# Patient Record
Sex: Female | Born: 1995
Health system: Southern US, Community
[De-identification: ages and names within clinical notes are randomized; demographics above are authoritative.]

## PROBLEM LIST (undated history)

## (undated) ENCOUNTER — Inpatient Hospital Stay (HOSPITAL_COMMUNITY): Payer: Self-pay

## (undated) DIAGNOSIS — O0930 Supervision of pregnancy with insufficient antenatal care, unspecified trimester: Secondary | ICD-10-CM

## (undated) DIAGNOSIS — F1291 Cannabis use, unspecified, in remission: Secondary | ICD-10-CM

## (undated) DIAGNOSIS — N159 Renal tubulo-interstitial disease, unspecified: Secondary | ICD-10-CM

## (undated) DIAGNOSIS — Z87898 Personal history of other specified conditions: Secondary | ICD-10-CM

## (undated) DIAGNOSIS — N390 Urinary tract infection, site not specified: Secondary | ICD-10-CM

## (undated) DIAGNOSIS — J40 Bronchitis, not specified as acute or chronic: Secondary | ICD-10-CM

## (undated) DIAGNOSIS — Z87891 Personal history of nicotine dependence: Secondary | ICD-10-CM

## (undated) HISTORY — PX: DILATION AND CURETTAGE OF UTERUS: SHX78

---
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
## 2014-03-24 ENCOUNTER — Encounter (HOSPITAL_COMMUNITY): Payer: Self-pay | Admitting: Emergency Medicine

## 2014-03-24 ENCOUNTER — Emergency Department (HOSPITAL_COMMUNITY)
Admission: EM | Admit: 2014-03-24 | Discharge: 2014-03-24 | Disposition: A | Discharge status: Home / Self Care | Payer: Medicaid Other | Attending: Emergency Medicine | Admitting: Emergency Medicine

## 2014-03-24 DIAGNOSIS — Z3202 Encounter for pregnancy test, result negative: Secondary | ICD-10-CM | POA: Insufficient documentation

## 2014-03-24 DIAGNOSIS — Z8709 Personal history of other diseases of the respiratory system: Secondary | ICD-10-CM | POA: Insufficient documentation

## 2014-03-24 DIAGNOSIS — B9689 Other specified bacterial agents as the cause of diseases classified elsewhere: Secondary | ICD-10-CM

## 2014-03-24 DIAGNOSIS — N760 Acute vaginitis: Secondary | ICD-10-CM | POA: Insufficient documentation

## 2014-03-24 LAB — URINALYSIS, ROUTINE W REFLEX MICROSCOPIC
BILIRUBIN URINE: NEGATIVE
GLUCOSE, UA: NEGATIVE mg/dL
Hgb urine dipstick: NEGATIVE
Ketones, ur: NEGATIVE mg/dL
NITRITE: NEGATIVE
PROTEIN: NEGATIVE mg/dL
Specific Gravity, Urine: 1.009 (ref 1.005–1.030)
Urobilinogen, UA: 0.2 mg/dL (ref 0.0–1.0)
pH: 6 (ref 5.0–8.0)

## 2014-03-24 LAB — WET PREP, GENITAL
Trich, Wet Prep: NONE SEEN
YEAST WET PREP: NONE SEEN

## 2014-03-24 LAB — URINE MICROSCOPIC-ADD ON

## 2014-03-24 LAB — POC URINE PREG, ED: PREG TEST UR: NEGATIVE

## 2014-03-24 MED ORDER — METRONIDAZOLE 500 MG PO TABS: 500.0000 mg | ORAL_TABLET | Freq: Two times a day (BID) | ORAL | Status: DC

## 2014-03-24 NOTE — ED Provider Notes (Signed)
CSN: [id number]     Arrival date & time 03/24/14  1130 History   First MD Initiated Contact with Patient 03/24/14 1135     Chief Complaint  Patient presents with  . Urinary Frequency  . Vaginitis     (Consider location/radiation/quality/duration/timing/severity/associated sxs/prior Treatment) HPI Comments: This is a 18 year old female who presents to the emergency department complaining of intermittent dysuria and frequency over the past 2 weeks. States sometimes when she urinates she has burning in her urethra, however other times she does not. She also reports a foul smelling odor from her vagina intermittently. Denies vaginal discharge, however reports when she is having intercourse she can tell that there is a foul-smell. Denies abdominal pain, back pain, nausea, vomiting, fever or chills. States she has a history of bacterial vaginosis and this seems to be the same. The last time she was diagnosed with BV she did not complete the course of antibiotics and is concerned that the infection has remained. States she is sexually active with her "baby daddy" only, about 3-4 months ago she had sexual intercourse with a new partner, but states she uses protection. States she has no concern for any sexually transmitted diseases at this time.  Patient is a 18 y.o. female presenting with frequency. The history is provided by the patient.  Urinary Frequency    Past Medical History  Diagnosis Date  . Bronchitis    History reviewed. No pertinent past surgical history. History reviewed. No pertinent family history. History  Substance Use Topics  . Smoking status: Never Smoker   . Smokeless tobacco: Not on file  . Alcohol Use: No   OB History   Grav Para Term Preterm Abortions TAB SAB Ect Mult Living                 Review of Systems  Genitourinary: Positive for dysuria and frequency.       + Vaginal odor.      Allergies  Review of patient's allergies indicates no known  allergies.  Home Medications   Prior to Admission medications   Medication Sig Start Date End Date Taking? Authorizing Provider  metroNIDAZOLE (FLAGYL) 500 MG tablet Take 1 tablet (500 mg total) by mouth 2 (two) times daily. One po bid x 7 days 03/24/14   Trevor Maceobyn M Albert, PA-C   BP 106/50  Pulse 81  Temp(Src) 97.8 F (36.6 C) (Oral)  Resp 16  Ht 5\' 7"  (1.702 m)  Wt 125 lb (56.7 kg)  BMI 19.57 kg/m2  SpO2 100%  LMP 03/15/2014 Physical Exam  Nursing note and vitals reviewed. Constitutional: She is oriented to person, place, and time. She appears well-developed and well-nourished. No distress.  HENT:  Head: Normocephalic and atraumatic.  Mouth/Throat: Oropharynx is clear and moist.  Eyes: Conjunctivae and EOM are normal.  Neck: Normal range of motion. Neck supple.  Cardiovascular: Normal rate, regular rhythm and normal heart sounds.   Pulmonary/Chest: Effort normal and breath sounds normal. No respiratory distress.  Abdominal: Soft. Bowel sounds are normal. There is no tenderness.  Genitourinary:  NEFG. Speculum exam- moderate amount of white vaginal discharge. No erythema or tenderness. No bleeding. No cervical discharge or friability. Bimanual- no CMT or adnexal tenderness. Uterus normal.  Musculoskeletal: Normal range of motion. She exhibits no edema.  Neurological: She is alert and oriented to person, place, and time. No sensory deficit.  Skin: Skin is warm and dry.  Psychiatric: She has a normal mood and affect. Her behavior is normal.  ED Course  Procedures (including critical care time) Labs Review Labs Reviewed  WET PREP, GENITAL - Abnormal; Notable for the following:    Clue Cells Wet Prep HPF POC FEW (*)    WBC, Wet Prep HPF POC FEW (*)    All other components within normal limits  URINALYSIS, ROUTINE W REFLEX MICROSCOPIC - Abnormal; Notable for the following:    APPearance CLOUDY (*)    Leukocytes, UA MODERATE (*)    All other components within normal limits   URINE MICROSCOPIC-ADD ON - Abnormal; Notable for the following:    Squamous Epithelial / LPF MANY (*)    Bacteria, UA FEW (*)    All other components within normal limits  GC/CHLAMYDIA PROBE AMP  URINE CULTURE  POC URINE PREG, ED    Imaging Review No results found.   EKG Interpretation None      MDM   Final diagnoses:  BV (bacterial vaginosis)   Pt w vaginal odor and dysuria. She is non-toxic appearing and in NAD. AFVSS. Abdomen soft and non-tender. No CMT or adnexal tenderness. Wet prep with few clue cells and WBC. UA contaminated with squamous cells, has moderate leuks, few bacteria and 11-20 WBC, nitrite negative. Given vaginal d/c, I do not feel this is significant for UTI, however vaginal infection. Given urine equivocal, urine culture pending. Pt d/c with flagyl. Infection care/precautions discussed. Return precautions given. Patient states understanding of treatment care plan and is agreeable.   Trevor Mace, PA-C 03/24/14 [phone]

## 2014-03-24 NOTE — Discharge Instructions (Signed)
Take antibiotic to completion.  Bacterial Vaginosis Bacterial vaginosis is a vaginal infection that occurs when the normal balance of bacteria in the vagina is disrupted. It results from an overgrowth of certain bacteria. This is the most common vaginal infection in women of childbearing age. Treatment is important to prevent complications, especially in pregnant women, as it can cause a premature delivery. CAUSES  Bacterial vaginosis is caused by an increase in harmful bacteria that are normally present in smaller amounts in the vagina. Several different kinds of bacteria can cause bacterial vaginosis. However, the reason that the condition develops is not fully understood. RISK FACTORS Certain activities or behaviors can put you at an increased risk of developing bacterial vaginosis, including:  Having a new sex partner or multiple sex partners.  Douching.  Using an intrauterine device (IUD) for contraception. Women do not get bacterial vaginosis from toilet seats, bedding, swimming pools, or contact with objects around them. SIGNS AND SYMPTOMS  Some women with bacterial vaginosis have no signs or symptoms. Common symptoms include:  Grey vaginal discharge.  A fishlike odor with discharge, especially after sexual intercourse.  Itching or burning of the vagina and vulva.  Burning or pain with urination. DIAGNOSIS  Your health care provider will take a medical history and examine the vagina for signs of bacterial vaginosis. A sample of vaginal fluid may be taken. Your health care provider will look at this sample under a microscope to check for bacteria and abnormal cells. A vaginal pH test may also be done.  TREATMENT  Bacterial vaginosis may be treated with antibiotic medicines. These may be given in the form of a pill or a vaginal cream. A second round of antibiotics may be prescribed if the condition comes back after treatment.  HOME CARE INSTRUCTIONS   Only take over-the-counter or  prescription medicines as directed by your health care provider.  If antibiotic medicine was prescribed, take it as directed. Make sure you finish it even if you start to feel better.  Do not have sex until treatment is completed.  Tell all sexual partners that you have a vaginal infection. They should see their health care provider and be treated if they have problems, such as a mild rash or itching.  Practice safe sex by using condoms and only having one sex partner. SEEK MEDICAL CARE IF:   Your symptoms are not improving after 3 days of treatment.  You have increased discharge or pain.  You have a fever. MAKE SURE YOU:   Understand these instructions.  Will watch your condition.  Will get help right away if you are not doing well or get worse. FOR MORE INFORMATION  Centers for Disease Control and Prevention, Division of STD Prevention: SolutionApps.co.zawww.cdc.gov/std American Sexual Health Association (ASHA): www.ashastd.org  Document Released: 06/09/2005 Document Revised: 03/30/2013 Document Reviewed: 01/19/2013 [facility] CenterExitCare Patient Information 2015 [employer], [place]. This information is not intended to replace advice given to you by your health care provider. Make sure you discuss any questions you have with your health care provider.

## 2014-03-24 NOTE — ED Notes (Signed)
Per pt sts she is having dysuria, and vaginal problems.

## 2014-03-25 LAB — GC/CHLAMYDIA PROBE AMP
CT Probe RNA: NEGATIVE
GC PROBE AMP APTIMA: NEGATIVE

## 2014-03-25 NOTE — ED Provider Notes (Signed)
Medical screening examination/treatment/procedure(s) were performed by non-physician practitioner and as supervising physician I was immediately available for consultation/collaboration.   EKG Interpretation None        Warnell Foresterrey Maribell Demeo, MD 03/25/14 [phone]

## 2014-03-26 LAB — URINE CULTURE: Colony Count: >=100000

## 2014-03-28 ENCOUNTER — Telehealth (HOSPITAL_BASED_OUTPATIENT_CLINIC_OR_DEPARTMENT_OTHER): Payer: Self-pay

## 2014-03-28 NOTE — Telephone Encounter (Signed)
Post ED Visit - Positive Culture Follow-up  Culture report reviewed by antimicrobial stewardship pharmacist: []  Wes Dulaney, Pharm.D., BCPS [x]  Celedonio MiyamotoJeremy Frens, [address] BCPS []  Georgina PillionElizabeth Martin, Pharm.D., BCPS []  [place]ampsteadMinh Pham, [address]Ph[address]., BCPS, AAHIVP []  Estella HuskMichelle Turner, Pharm.D., BCPS, AAHIVP []  Carly Sabat, Pharm.D. []  Enzo BiNathan Batchelder, [address]Ph[address]  Positive Urine culture Plan: No further treatment.  No further patient follow-up is required at this time.  Arvid RightClark, Aniceto Kyser Dorn 03/28/2014, 5:58 AM

## 2014-06-23 NOTE — L&D Delivery Note (Signed)
Patient had variable decels down to 90 with last 3 contractions before delivery. Strip reactive throughout pushing. IUPC removed immediately before delivery. Cord blood donor.   Delivery Note At 1:49 PM a viable and healthy female was delivered via VBAC, Spontaneous (Presentation: Right Occiput Anterior, compound presentation with right hand by face).  APGAR: 9, 9; weight .   Placenta status: Intact, Spontaneous.  Cord: 3 vessels with the following complications: None.  Cord pH: N/A  Anesthesia: Epidural  Episiotomy: None Lacerations: shallow 1st, hemostatic and unrepaired. Left labial, hemostatic Suture Repair: none Est. Blood Loss (mL): 283  Mom to postpartum.  Baby to Couplet care / Skin to Skin.  De Hollingshead 01/25/2015, 2:42 PM   OB fellow attestation: Patient is a [id number] at [redacted]w[redacted]d who was admitted for SROM at 3cm, significant hx of h/o CS/uncomplicated prenatal course.  She progressed with augmentation via pitocin.  I was gloved and present for delivery in its entirety.  Second stage of labor progressed, variable decels during second stage noted.  Complications: none  Lacerations: shallow 1st degree hemostatic,  Left labial, hemostatic  EBL: 283  Federico Flake, MD 7:24 PM

## 2014-09-21 ENCOUNTER — Emergency Department (HOSPITAL_COMMUNITY)
Admission: EM | Admit: 2014-09-21 | Discharge: 2014-09-21 | Discharge status: Against Medical Advice | Payer: Medicaid Other | Attending: Emergency Medicine | Admitting: Emergency Medicine

## 2014-09-21 ENCOUNTER — Encounter (HOSPITAL_COMMUNITY): Payer: Self-pay | Admitting: Emergency Medicine

## 2014-09-21 DIAGNOSIS — O9989 Other specified diseases and conditions complicating pregnancy, childbirth and the puerperium: Secondary | ICD-10-CM | POA: Insufficient documentation

## 2014-09-21 DIAGNOSIS — R109 Unspecified abdominal pain: Secondary | ICD-10-CM | POA: Insufficient documentation

## 2014-09-21 DIAGNOSIS — Z3A01 Less than 8 weeks gestation of pregnancy: Secondary | ICD-10-CM | POA: Diagnosis not present

## 2014-09-21 DIAGNOSIS — O2391 Unspecified genitourinary tract infection in pregnancy, first trimester: Secondary | ICD-10-CM | POA: Diagnosis not present

## 2014-09-21 DIAGNOSIS — Z3490 Encounter for supervision of normal pregnancy, unspecified, unspecified trimester: Secondary | ICD-10-CM

## 2014-09-21 DIAGNOSIS — Z8709 Personal history of other diseases of the respiratory system: Secondary | ICD-10-CM | POA: Diagnosis not present

## 2014-09-21 DIAGNOSIS — N644 Mastodynia: Secondary | ICD-10-CM | POA: Insufficient documentation

## 2014-09-21 DIAGNOSIS — O219 Vomiting of pregnancy, unspecified: Secondary | ICD-10-CM | POA: Diagnosis not present

## 2014-09-21 LAB — COMPREHENSIVE METABOLIC PANEL
ALT: 11 U/L (ref 0–35)
ANION GAP: 8 (ref 5–15)
AST: 21 U/L (ref 0–37)
Albumin: 3.6 g/dL (ref 3.5–5.2)
Alkaline Phosphatase: 62 U/L (ref 39–117)
BUN: 9 mg/dL (ref 6–23)
CALCIUM: 9 mg/dL (ref 8.4–10.5)
CO2: 20 mmol/L (ref 19–32)
CREATININE: 0.54 mg/dL (ref 0.50–1.10)
Chloride: 108 mmol/L (ref 96–112)
GLUCOSE: 87 mg/dL (ref 70–99)
Potassium: 3.4 mmol/L — ABNORMAL LOW (ref 3.5–5.1)
SODIUM: 136 mmol/L (ref 135–145)
Total Bilirubin: 0.2 mg/dL — ABNORMAL LOW (ref 0.3–1.2)
Total Protein: 7.3 g/dL (ref 6.0–8.3)

## 2014-09-21 LAB — CBC WITH DIFFERENTIAL/PLATELET
BASOS ABS: 0 10*3/uL (ref 0.0–0.1)
BASOS PCT: 0 % (ref 0–1)
EOS PCT: 2 % (ref 0–5)
Eosinophils Absolute: 0.2 10*3/uL (ref 0.0–0.7)
HEMATOCRIT: 35.7 % — AB (ref 36.0–46.0)
Hemoglobin: 12.1 g/dL (ref 12.0–15.0)
Lymphocytes Relative: 19 % (ref 12–46)
Lymphs Abs: 2.1 10*3/uL (ref 0.7–4.0)
MCH: 29.7 pg (ref 26.0–34.0)
MCHC: 33.9 g/dL (ref 30.0–36.0)
MCV: 87.7 fL (ref 78.0–100.0)
MONO ABS: 0.6 10*3/uL (ref 0.1–1.0)
Monocytes Relative: 5 % (ref 3–12)
Neutro Abs: 8.7 10*3/uL — ABNORMAL HIGH (ref 1.7–7.7)
Neutrophils Relative %: 74 % (ref 43–77)
Platelets: 195 10*3/uL (ref 150–400)
RBC: 4.07 MIL/uL (ref 3.87–5.11)
RDW: 13.2 % (ref 11.5–15.5)
WBC: 11.5 10*3/uL — ABNORMAL HIGH (ref 4.0–10.5)

## 2014-09-21 LAB — URINALYSIS, ROUTINE W REFLEX MICROSCOPIC
Bilirubin Urine: NEGATIVE
Glucose, UA: NEGATIVE mg/dL
Hgb urine dipstick: NEGATIVE
Ketones, ur: NEGATIVE mg/dL
NITRITE: NEGATIVE
PROTEIN: NEGATIVE mg/dL
Specific Gravity, Urine: 1.021 (ref 1.005–1.030)
UROBILINOGEN UA: 0.2 mg/dL (ref 0.0–1.0)
pH: 7.5 (ref 5.0–8.0)

## 2014-09-21 LAB — LIPASE, BLOOD: Lipase: 18 U/L (ref 11–59)

## 2014-09-21 LAB — URINE MICROSCOPIC-ADD ON

## 2014-09-21 LAB — POC URINE PREG, ED: Preg Test, Ur: POSITIVE — AB

## 2014-09-21 LAB — ABO/RH: ABO/RH(D): O POS

## 2014-09-21 NOTE — ED Provider Notes (Signed)
CSN: [id number]     Arrival date & time 09/21/14  1729 History   First MD Initiated Contact with Patient 09/21/14 1946     Chief Complaint  Patient presents with  . Abdominal Pain     (Consider location/radiation/quality/duration/timing/severity/associated sxs/prior Treatment) HPI   Latasha Mitchell Is a 19 year old female who presents emergency Department with chief complaint of fatigue, breast pain and nausea for the past 2-3 weeks. Patient states that she has had severe difficulty waking up, she has also had bilateral lower leg, lower crampy abdominal pain. She had some spotting last month, but did not have a period. She states she has irregular periods and does not know the first day of her last menstrual period specifically. Patient does not know what her blood type is. She denies any fevers, chills, urinary symptoms, vaginal symptoms.  Past Medical History  Diagnosis Date  . Bronchitis    No past surgical history on file. No family history on file. History  Substance Use Topics  . Smoking status: Never Smoker   . Smokeless tobacco: Not on file  . Alcohol Use: No   OB History    No data available     Review of Systems  Ten systems reviewed and are negative for acute change, except as noted in the HPI.    Allergies  Review of patient's allergies indicates no known allergies.  Home Medications   Prior to Admission medications   Medication Sig Start Date End Date Taking? Authorizing Provider  metroNIDAZOLE (FLAGYL) 500 MG tablet Take 1 tablet (500 mg total) by mouth 2 (two) times daily. One po bid x 7 days Patient not taking: Reported on 09/21/2014 03/24/14   Nada Boozer Hess, PA-C   BP 127/57 mmHg  Pulse 87  Temp(Src) 98.4 F (36.9 C) (Oral)  Resp 16  SpO2 98%  LMP 09/07/2014 (Approximate) Physical Exam  Constitutional: She is oriented to person, place, and time. She appears well-developed and well-nourished. No distress.  HENT:  Head: Normocephalic and  atraumatic.  Eyes: Conjunctivae are normal. No scleral icterus.  Neck: Normal range of motion.  Cardiovascular: Normal rate, regular rhythm and normal heart sounds.  Exam reveals no gallop and no friction rub.   No murmur heard. Pulmonary/Chest: Effort normal and breath sounds normal. No respiratory distress.  Abdominal: Soft. Bowel sounds are normal. She exhibits distension (fundus palpable above the pelvis). She exhibits no mass. There is no tenderness. There is no guarding.  Neurological: She is alert and oriented to person, place, and time.  Skin: Skin is warm and dry. She is not diaphoretic.    ED Course  Procedures (including critical care time) Labs Review Labs Reviewed  CBC WITH DIFFERENTIAL/PLATELET - Abnormal; Notable for the following:    WBC 11.5 (*)    HCT 35.7 (*)    Neutro Abs 8.7 (*)    All other components within normal limits  COMPREHENSIVE METABOLIC PANEL - Abnormal; Notable for the following:    Potassium 3.4 (*)    Total Bilirubin 0.2 (*)    All other components within normal limits  URINALYSIS, ROUTINE W REFLEX MICROSCOPIC - Abnormal; Notable for the following:    Leukocytes, UA SMALL (*)    All other components within normal limits  POC URINE PREG, ED - Abnormal; Notable for the following:    Preg Test, Ur POSITIVE (*)    All other components within normal limits  WET PREP, GENITAL  LIPASE, BLOOD  URINE MICROSCOPIC-ADD ON  HIV ANTIBODY (  ROUTINE TESTING)  RPR  ABO/RH  GC/CHLAMYDIA PROBE AMP ([place])    Imaging Review No results found.   EKG Interpretation None      MDM   Final diagnoses:  None    Patient with a positive urinary pregnancy test. Mild hypokalemia. 3.4. Patient is declining further workup at this time. She states she was about a half hour away and will not be able to use a bus pass to get home. She only has a ride that can get her rate down. She is concerned that she would not be able to get back to her other  child. Discussed risks of leaving which include not knowing her blood type and possible detrimental effects of the use if she is Rh-. I also discussed the fact that will not have any results concerning any vaginal infections. Patient expresses understanding. States that she will return as soon as possible to complete her workup. She appears competent to make this decision.  Date: 09/21/2014 Patient: Latasha Mitchell Admitted: 09/21/2014  6:16 PM Attending Provider: No att. providers found  Latasha Mitchell or her authorized caregiver has made the decision for the patient to leave the emergency department against the advice of Arthor CaptainAbigail Elsie Baynes, PA-C.  She or her authorized caregiver has been informed and understands the inherent risks, including death.  She or her authorized caregiver has decided to accept the responsibility for this decision. Latasha Mitchell and all necessary parties have been advised that she may return for further evaluation or treatment. Her condition at time of discharge was Good.  Latasha Mitchell had current vital signs as follows:  Blood pressure 127/57, pulse 87, temperature 98.4 F (36.9 C), temperature source Oral, resp. rate 16, last menstrual period 09/07/2014, SpO2 98 %.   Latasha Mitchell or her authorized caregiver has signed the Leaving Against Medical Advice form prior to leaving the department.  Arthor CaptainHarris, Roosevelt Bisher 09/21/2014       Arthor CaptainAbigail Nerea Bordenave, PA-C 09/21/14 2045  Doug SouSam Jacubowitz, MD 09/22/14 0100

## 2014-09-21 NOTE — ED Notes (Signed)
Pt states that she has been having low, crampy abd pain x 2 wks.  States that she thinks she may be pregnant because her period was very light.  Bleeding was 2-3 wks ago.  Reports vomiting, no diarrhea.

## 2014-09-22 LAB — RPR: RPR: NONREACTIVE

## 2014-09-22 LAB — HIV ANTIBODY (ROUTINE TESTING W REFLEX): HIV SCREEN 4TH GENERATION: NONREACTIVE

## 2014-09-30 ENCOUNTER — Encounter (HOSPITAL_COMMUNITY): Payer: Self-pay | Admitting: *Deleted

## 2014-09-30 ENCOUNTER — Inpatient Hospital Stay (HOSPITAL_COMMUNITY)
Admission: AD | Admit: 2014-09-30 | Discharge: 2014-09-30 | Disposition: A | Discharge status: Home / Self Care | Payer: Medicaid Other | Source: Ambulatory Visit | Attending: Obstetrics & Gynecology | Admitting: Obstetrics & Gynecology

## 2014-09-30 ENCOUNTER — Inpatient Hospital Stay (HOSPITAL_COMMUNITY): Payer: Medicaid Other

## 2014-09-30 DIAGNOSIS — O23592 Infection of other part of genital tract in pregnancy, second trimester: Secondary | ICD-10-CM | POA: Insufficient documentation

## 2014-09-30 DIAGNOSIS — O98812 Other maternal infectious and parasitic diseases complicating pregnancy, second trimester: Secondary | ICD-10-CM

## 2014-09-30 DIAGNOSIS — W108XXA Fall (on) (from) other stairs and steps, initial encounter: Secondary | ICD-10-CM

## 2014-09-30 DIAGNOSIS — N760 Acute vaginitis: Secondary | ICD-10-CM | POA: Diagnosis not present

## 2014-09-30 DIAGNOSIS — Z3A22 22 weeks gestation of pregnancy: Secondary | ICD-10-CM | POA: Insufficient documentation

## 2014-09-30 DIAGNOSIS — Z87891 Personal history of nicotine dependence: Secondary | ICD-10-CM | POA: Diagnosis not present

## 2014-09-30 DIAGNOSIS — B373 Candidiasis of vulva and vagina: Secondary | ICD-10-CM

## 2014-09-30 DIAGNOSIS — O9989 Other specified diseases and conditions complicating pregnancy, childbirth and the puerperium: Secondary | ICD-10-CM | POA: Insufficient documentation

## 2014-09-30 DIAGNOSIS — S21109A Unspecified open wound of unspecified front wall of thorax without penetration into thoracic cavity, initial encounter: Secondary | ICD-10-CM

## 2014-09-30 DIAGNOSIS — W109XXA Fall (on) (from) unspecified stairs and steps, initial encounter: Secondary | ICD-10-CM | POA: Diagnosis not present

## 2014-09-30 DIAGNOSIS — S299XXA Unspecified injury of thorax, initial encounter: Secondary | ICD-10-CM

## 2014-09-30 DIAGNOSIS — W19XXXA Unspecified fall, initial encounter: Secondary | ICD-10-CM

## 2014-09-30 DIAGNOSIS — B3731 Acute candidiasis of vulva and vagina: Secondary | ICD-10-CM

## 2014-09-30 DIAGNOSIS — O9A219 Injury, poisoning and certain other consequences of external causes complicating pregnancy, unspecified trimester: Secondary | ICD-10-CM | POA: Insufficient documentation

## 2014-09-30 DIAGNOSIS — O9A12 Malignant neoplasm complicating childbirth: Secondary | ICD-10-CM

## 2014-09-30 DIAGNOSIS — Z3689 Encounter for other specified antenatal screening: Secondary | ICD-10-CM | POA: Insufficient documentation

## 2014-09-30 DIAGNOSIS — O4690 Antepartum hemorrhage, unspecified, unspecified trimester: Secondary | ICD-10-CM | POA: Insufficient documentation

## 2014-09-30 LAB — URINALYSIS, ROUTINE W REFLEX MICROSCOPIC
Bilirubin Urine: NEGATIVE
Glucose, UA: NEGATIVE mg/dL
Hgb urine dipstick: NEGATIVE
Ketones, ur: NEGATIVE mg/dL
Leukocytes, UA: NEGATIVE
NITRITE: NEGATIVE
Protein, ur: NEGATIVE mg/dL
SPECIFIC GRAVITY, URINE: 1.02 (ref 1.005–1.030)
Urobilinogen, UA: 0.2 mg/dL (ref 0.0–1.0)
pH: 6.5 (ref 5.0–8.0)

## 2014-09-30 LAB — RAPID URINE DRUG SCREEN, HOSP PERFORMED
Amphetamines: NOT DETECTED
BARBITURATES: NOT DETECTED
Benzodiazepines: NOT DETECTED
Cocaine: NOT DETECTED
Opiates: NOT DETECTED
Tetrahydrocannabinol: POSITIVE — AB

## 2014-09-30 LAB — WET PREP, GENITAL
Clue Cells Wet Prep HPF POC: NONE SEEN
Trich, Wet Prep: NONE SEEN

## 2014-09-30 LAB — OB RESULTS CONSOLE GC/CHLAMYDIA
Chlamydia: NEGATIVE
Gonorrhea: NEGATIVE

## 2014-09-30 MED ORDER — FLUCONAZOLE 150 MG PO TABS: 150.0000 mg | ORAL_TABLET | Freq: Every day | ORAL | Status: DC

## 2014-09-30 NOTE — Discharge Instructions (Signed)
Candida Infection A Candida infection (also called yeast, fungus, and Monilia infection) is an overgrowth of yeast that can occur anywhere on the body. A yeast infection commonly occurs in warm, moist body areas. Usually, the infection remains localized but can spread to become a systemic infection. A yeast infection may be a sign of a more severe disease such as diabetes, leukemia, or AIDS. A yeast infection can occur in both men and women. In women, Candida vaginitis is a vaginal infection. It is one of the most common causes of vaginitis. Men usually do not have symptoms or know they have an infection until other problems develop. Men may find out they have a yeast infection because their sex partner has a yeast infection. Uncircumcised men are more likely to get a yeast infection than circumcised men. This is because the uncircumcised glans is not exposed to air and does not remain as dry as that of a circumcised glans. Older adults may develop yeast infections around dentures. CAUSES  Women  Antibiotics.  Steroid medication taken for a long time.  Being overweight (obese).  Diabetes.  Poor immune condition.  Certain serious medical conditions.  Immune suppressive medications for organ transplant patients.  Chemotherapy.  Pregnancy.  Menstruation.  Stress and fatigue.  Intravenous drug use.  Oral contraceptives.  Wearing tight-fitting clothes in the crotch area.  Catching it from a sex partner who has a yeast infection.  Spermicide.  Intravenous, urinary, or other catheters. Men  Catching it from a sex partner who has a yeast infection.  Having oral or anal sex with a person who has the infection.  Spermicide.  Diabetes.  Antibiotics.  Poor immune system.  Medications that suppress the immune system.  Intravenous drug use.  Intravenous, urinary, or other catheters. SYMPTOMS  Women  Thick, white vaginal discharge.  Vaginal itching.  Redness and  swelling in and around the vagina.  Irritation of the lips of the vagina and perineum.  Blisters on the vaginal lips and perineum.  Painful sexual intercourse.  Low blood sugar (hypoglycemia).  Painful urination.  Bladder infections.  Intestinal problems such as constipation, indigestion, bad breath, bloating, increase in gas, diarrhea, or loose stools. Men  Men may develop intestinal problems such as constipation, indigestion, bad breath, bloating, increase in gas, diarrhea, or loose stools.  Dry, cracked skin on the penis with itching or discomfort.  Jock itch.  Dry, flaky skin.  Athlete's foot.  Hypoglycemia. DIAGNOSIS  Women  A history and an exam are performed.  The discharge may be examined under a microscope.  A culture may be taken of the discharge. Men  A history and an exam are performed.  Any discharge from the penis or areas of cracked skin will be looked at under the microscope and cultured.  Stool samples may be cultured. TREATMENT  Women  Vaginal antifungal suppositories and creams.  Medicated creams to decrease irritation and itching on the outside of the vagina.  Warm compresses to the perineal area to decrease swelling and discomfort.  Oral antifungal medications.  Medicated vaginal suppositories or cream for repeated or recurrent infections.  Wash and dry the irritation areas before applying the cream.  Eating yogurt with Lactobacillus may help with prevention and treatment.  Sometimes painting the vagina with gentian violet solution may help if creams and suppositories do not work. Men  Antifungal creams and oral antifungal medications.  Sometimes treatment must continue for 30 days after the symptoms go away to prevent recurrence. HOME CARE INSTRUCTIONS  Women  Use cotton underwear and avoid tight-fitting clothing.  Avoid colored, scented toilet paper and deodorant tampons or pads.  Do not douche.  Keep your diabetes  under control.  Finish all the prescribed medications.  Keep your skin clean and dry.  Consume milk or yogurt with Lactobacillus-active culture regularly. If you get frequent yeast infections and think that is what the infection is, there are over-the-counter medications that you can get. If the infection does not show healing in 3 days, talk to your caregiver.  Tell your sex partner you have a yeast infection. Your partner may need treatment also, especially if your infection does not clear up or recurs. Men  Keep your skin clean and dry.  Keep your diabetes under control.  Finish all prescribed medications.  Tell your sex partner that you have a yeast infection so he or she can be treated if necessary. SEEK MEDICAL CARE IF:   Your symptoms do not clear up or worsen in one week after treatment.  You have an oral temperature above 102 F (38.9 C).  You have trouble swallowing or eating for a prolonged time.  You develop blisters on and around your vagina.  You develop vaginal bleeding and it is not your menstrual period.  You develop abdominal pain.  You develop intestinal problems as mentioned above.  You get weak or light-headed.  You have painful or increased urination.  You have pain during sexual intercourse. MAKE SURE YOU:   Understand these instructions.  Will watch your condition.  Will get help right away if you are not doing well or get worse. Document Released: 07/17/2004 Document Revised: 10/24/2013 Document Reviewed: 10/29/2009 [facility] Patient Information 2015 [place]. This information is not intended to replace advice given to you by your health care provider. Make sure you discuss any questions you have with your health care provider.  Fall Prevention and Home Safety Falls cause injuries and can affect all age groups. It is possible to use preventive measures to significantly decrease the likelihood of falls. There are many simple measures  which can make your home safer and prevent falls. OUTDOORS  Repair cracks and edges of walkways and driveways.  Remove high doorway thresholds.  Trim shrubbery on the main path into your home.  Have good outside lighting.  Clear walkways of tools, rocks, debris, and clutter.  Check that handrails are not broken and are securely fastened. Both sides of steps should have handrails.  Have leaves, snow, and ice cleared regularly.  Use sand or salt on walkways during winter months.  In the garage, clean up grease or oil spills. BATHROOM  Install night lights.  Install grab bars by the toilet and in the tub and shower.  Use non-skid mats or decals in the tub or shower.  Place a plastic non-slip stool in the shower to sit on, if needed.  Keep floors dry and clean up all water on the floor immediately.  Remove soap buildup in the tub or shower on a regular basis.  Secure bath mats with non-slip, double-sided rug tape.  Remove throw rugs and tripping hazards from the floors. BEDROOMS  Install night lights.  Make sure a bedside light is easy to reach.  Do not use oversized bedding.  Keep a telephone by your bedside.  Have a firm chair with side arms to use for getting dressed.  Remove throw rugs and tripping hazards from the floor. KITCHEN  Keep handles on pots and pans turned toward the  center of the stove. Use back burners when possible.  Clean up spills quickly and allow time for drying.  Avoid walking on wet floors.  Avoid hot utensils and knives.  Position shelves so they are not too high or low.  Place commonly used objects within easy reach.  If necessary, use a sturdy step stool with a grab bar when reaching.  Keep electrical cables out of the way.  Do not use floor polish or wax that makes floors slippery. If you must use wax, use non-skid floor wax.  Remove throw rugs and tripping hazards from the floor. STAIRWAYS  Never leave objects on  stairs.  Place handrails on both sides of stairways and use them. Fix any loose handrails. Make sure handrails on both sides of the stairways are as long as the stairs.  Check carpeting to make sure it is firmly attached along stairs. Make repairs to worn or loose carpet promptly.  Avoid placing throw rugs at the top or bottom of stairways, or properly secure the rug with carpet tape to prevent slippage. Get rid of throw rugs, if possible.  Have an electrician put in a light switch at the top and bottom of the stairs. OTHER FALL PREVENTION TIPS  Wear low-heel or rubber-soled shoes that are supportive and fit well. Wear closed toe shoes.  When using a stepladder, make sure it is fully opened and both spreaders are firmly locked. Do not climb a closed stepladder.  Add color or contrast paint or tape to grab bars and handrails in your home. Place contrasting color strips on first and last steps.  Learn and use mobility aids as needed. Install an electrical emergency response system.  Turn on lights to avoid dark areas. Replace light bulbs that burn out immediately. Get light switches that glow.  Arrange furniture to create clear pathways. Keep furniture in the same place.  Firmly attach carpet with non-skid or double-sided tape.  Eliminate uneven floor surfaces.  Select a carpet pattern that does not visually hide the edge of steps.  Be aware of all pets. OTHER HOME SAFETY TIPS  Set the water temperature for 120 F (48.8 C).  Keep emergency numbers on or near the telephone.  Keep smoke detectors on every level of the home and near sleeping areas. Document Released: 05/30/2002 Document Revised: 12/09/2011 Document Reviewed: 08/29/2011 [facility] Patient Information 2015 [place], [place]. This information is not intended to replace advice given to you by your health care provider. Make sure you discuss any questions you have with your health care provider.

## 2014-09-30 NOTE — MAU Note (Signed)
Pt presents to MAU with complaints of falling down a flight of 10-12 stairs today landing on her left leg. Unknown LMP states she thinks her last one was the end of last year. Irregular cycles. Reports some vaginal bleeding when she wipes.

## 2014-09-30 NOTE — MAU Provider Note (Signed)
History     CSN: [id number]  Arrival date and time: 09/30/14 1530   First Provider Initiated Contact with Patient 09/30/14 1606      Chief Complaint  Patient presents with  . Fall  . Vaginal Bleeding   HPI   [name] is a 19 y.o. female [id number] at [redacted]w[redacted]d who presents with concerns regarding a fall she had yesterday around 1300. She fell going down the stairs and landed on her side; she did not land directly on her abdomen. She was helping her two year old down the stairs. Currently she is not having any pain and did not have any pain following the fall " a cramp here and there". She has noticed the last two times she has peed, little specs of blood in her urine. The bleeding that she saw was very light; she never had to wear a pad. She had intercourse in the last 24 hours and the blood was noted after intercourse. She did not notice any blood since she has been here in MAU.   OB History    Gravida Para Term Preterm AB TAB SAB Ectopic Multiple Living   0 2 0 0 1      Past Medical History  Diagnosis Date  . Bronchitis     Past Surgical History  Procedure Laterality Date  . Dilation and curettage of uterus    . Cesarean section      History reviewed. No pertinent family history.  History  Substance Use Topics  . Smoking status: Former Games developer  . Smokeless tobacco: Never Used  . Alcohol Use: No    Allergies: No Known Allergies  Prescriptions prior to admission  Medication Sig Dispense Refill Last Dose  . metroNIDAZOLE (FLAGYL) 500 MG tablet Take 1 tablet (500 mg total) by mouth 2 (two) times daily. One po bid x 7 days (Patient not taking: Reported on 09/21/2014) 14 tablet 0    Results for orders placed or performed during the hospital encounter of 09/30/14 (from the past 48 hour(s))  Urinalysis, Routine w reflex microscopic     Status: Abnormal   Collection Time: 09/30/14  3:40 PM  Result Value Ref Range   Color, Urine YELLOW YELLOW   APPearance  HAZY (A) CLEAR   Specific Gravity, Urine 1.020 1.005 - 1.030   pH 6.5 5.0 - 8.0   Glucose, UA NEGATIVE NEGATIVE mg/dL   Hgb urine dipstick NEGATIVE NEGATIVE   Bilirubin Urine NEGATIVE NEGATIVE   Ketones, ur NEGATIVE NEGATIVE mg/dL   Protein, ur NEGATIVE NEGATIVE mg/dL   Urobilinogen, UA 0.2 0.0 - 1.0 mg/dL   Nitrite NEGATIVE NEGATIVE   Leukocytes, UA NEGATIVE NEGATIVE    Comment: MICROSCOPIC NOT DONE ON URINES WITH NEGATIVE PROTEIN, BLOOD, LEUKOCYTES, NITRITE, OR GLUCOSE <1000 mg/dL.  Urine rapid drug screen (hosp performed)     Status: Abnormal   Collection Time: 09/30/14  3:40 PM  Result Value Ref Range   Opiates NONE DETECTED NONE DETECTED   Cocaine NONE DETECTED NONE DETECTED   Benzodiazepines NONE DETECTED NONE DETECTED   Amphetamines NONE DETECTED NONE DETECTED   Tetrahydrocannabinol POSITIVE (A) NONE DETECTED   Barbiturates NONE DETECTED NONE DETECTED    Comment:        DRUG SCREEN FOR MEDICAL PURPOSES ONLY.  IF CONFIRMATION IS NEEDED FOR ANY PURPOSE, NOTIFY LAB WITHIN 5 DAYS.        LOWEST DETECTABLE LIMITS FOR URINE DRUG SCREEN Drug Class  Cutoff (ng/mL) Amphetamine      1000 Barbiturate      200 Benzodiazepine   200 Tricyclics       300 Opiates          300 Cocaine          300 THC              50   Wet prep, genital     Status: Abnormal   Collection Time: 09/30/14  4:30 PM  Result Value Ref Range   Yeast Wet Prep HPF POC MODERATE (A) NONE SEEN   Trich, Wet Prep NONE SEEN NONE SEEN   Clue Cells Wet Prep HPF POC NONE SEEN NONE SEEN   WBC, Wet Prep HPF POC FEW (A) NONE SEEN    Comment: MODERATE BACTERIA SEEN    Review of Systems  Constitutional: Negative for fever and chills.  Gastrointestinal: Positive for nausea. Negative for vomiting and abdominal pain.  Genitourinary: Negative for dysuria, urgency and frequency.   Physical Exam   Blood pressure 130/61, pulse 85, temperature 98.1 F (36.7 C), resp. rate 18, height 5\' 3"  (1.6 m), weight 60.782 kg  (134 lb), last menstrual period 06/23/2014.  Physical Exam  Constitutional: She is oriented to person, place, and time. She appears well-developed and well-nourished. No distress.  HENT:  Head: Normocephalic.  Eyes: Pupils are equal, round, and reactive to light.  Neck: Neck supple.  Genitourinary: Vaginal discharge found.  Speculum exam: Vagina - Small amount of thick white discharge on vaginal walls.  Cervix - No contact bleeding, no active bleeding  Bimanual exam: Cervix closed Uterus non tender, enlarged 15-20 weeks  Adnexa non tender, no masses bilaterally GC/Chlam, wet prep done Chaperone present for exam.  Musculoskeletal: Normal range of motion.  Neurological: She is alert and oriented to person, place, and time.  Skin: Skin is warm. She is not diaphoretic.  Psychiatric: Her behavior is normal.    MAU Course  Procedures  None  MDM  + fetal heart tones via doppler.  Preliminary report shows 6134w0d with no sign of abruption with normal cervical length.    Assessment and Plan   A:  Fall in pregnancy Yeast vaginitis  SIUP @[redacted]w[redacted]d   P:  Discharge home stable condition RX: Diflucan  Fall precautions discussed Message sent to [facility] to start prenatal care Return to MAU if symptoms worsen  Second trimester warning signs discussed   Duane LopeJennifer I Rasch, NP 09/30/2014 4:20 PM

## 2014-10-01 DIAGNOSIS — O4690 Antepartum hemorrhage, unspecified, unspecified trimester: Secondary | ICD-10-CM | POA: Insufficient documentation

## 2014-10-01 DIAGNOSIS — O9A219 Injury, poisoning and certain other consequences of external causes complicating pregnancy, unspecified trimester: Secondary | ICD-10-CM | POA: Insufficient documentation

## 2014-10-01 DIAGNOSIS — Z3689 Encounter for other specified antenatal screening: Secondary | ICD-10-CM | POA: Insufficient documentation

## 2014-10-01 DIAGNOSIS — Z3A22 22 weeks gestation of pregnancy: Secondary | ICD-10-CM | POA: Insufficient documentation

## 2014-10-02 LAB — GC/CHLAMYDIA PROBE AMP (~~LOC~~) NOT AT ARMC
Chlamydia: NEGATIVE
Neisseria Gonorrhea: NEGATIVE

## 2014-10-03 LAB — HIV ANTIBODY (ROUTINE TESTING W REFLEX): HIV Screen 4th Generation wRfx: NONREACTIVE

## 2014-10-07 ENCOUNTER — Encounter (HOSPITAL_COMMUNITY): Payer: Self-pay | Admitting: *Deleted

## 2014-10-07 ENCOUNTER — Inpatient Hospital Stay (EMERGENCY_DEPARTMENT_HOSPITAL)
Admission: AD | Admit: 2014-10-07 | Discharge: 2014-10-07 | Disposition: A | Discharge status: Home / Self Care | Payer: Medicaid Other | Source: Ambulatory Visit | Attending: Obstetrics & Gynecology | Admitting: Obstetrics & Gynecology

## 2014-10-07 ENCOUNTER — Encounter (HOSPITAL_COMMUNITY): Payer: Self-pay | Admitting: Emergency Medicine

## 2014-10-07 ENCOUNTER — Emergency Department (HOSPITAL_COMMUNITY)
Admission: EM | Admit: 2014-10-07 | Discharge: 2014-10-07 | Disposition: A | Discharge status: Home / Self Care | Payer: Medicaid Other | Attending: Emergency Medicine | Admitting: Emergency Medicine

## 2014-10-07 DIAGNOSIS — O9A212 Injury, poisoning and certain other consequences of external causes complicating pregnancy, second trimester: Secondary | ICD-10-CM | POA: Diagnosis present

## 2014-10-07 DIAGNOSIS — Z87891 Personal history of nicotine dependence: Secondary | ICD-10-CM | POA: Insufficient documentation

## 2014-10-07 DIAGNOSIS — S46912A Strain of unspecified muscle, fascia and tendon at shoulder and upper arm level, left arm, initial encounter: Secondary | ICD-10-CM | POA: Insufficient documentation

## 2014-10-07 DIAGNOSIS — R0981 Nasal congestion: Secondary | ICD-10-CM | POA: Insufficient documentation

## 2014-10-07 DIAGNOSIS — Z8709 Personal history of other diseases of the respiratory system: Secondary | ICD-10-CM | POA: Insufficient documentation

## 2014-10-07 DIAGNOSIS — Z792 Long term (current) use of antibiotics: Secondary | ICD-10-CM | POA: Diagnosis not present

## 2014-10-07 DIAGNOSIS — M542 Cervicalgia: Secondary | ICD-10-CM

## 2014-10-07 DIAGNOSIS — O9989 Other specified diseases and conditions complicating pregnancy, childbirth and the puerperium: Secondary | ICD-10-CM | POA: Insufficient documentation

## 2014-10-07 DIAGNOSIS — Y9389 Activity, other specified: Secondary | ICD-10-CM | POA: Insufficient documentation

## 2014-10-07 DIAGNOSIS — Y9289 Other specified places as the place of occurrence of the external cause: Secondary | ICD-10-CM | POA: Insufficient documentation

## 2014-10-07 DIAGNOSIS — Y998 Other external cause status: Secondary | ICD-10-CM | POA: Diagnosis not present

## 2014-10-07 DIAGNOSIS — Z3A22 22 weeks gestation of pregnancy: Secondary | ICD-10-CM | POA: Diagnosis not present

## 2014-10-07 DIAGNOSIS — X58XXXA Exposure to other specified factors, initial encounter: Secondary | ICD-10-CM | POA: Diagnosis not present

## 2014-10-07 DIAGNOSIS — S24109A Unspecified injury at unspecified level of thoracic spinal cord, initial encounter: Secondary | ICD-10-CM | POA: Insufficient documentation

## 2014-10-07 DIAGNOSIS — S46812A Strain of other muscles, fascia and tendons at shoulder and upper arm level, left arm, initial encounter: Secondary | ICD-10-CM

## 2014-10-07 DIAGNOSIS — M25512 Pain in left shoulder: Secondary | ICD-10-CM | POA: Insufficient documentation

## 2014-10-07 DIAGNOSIS — Z79899 Other long term (current) drug therapy: Secondary | ICD-10-CM | POA: Diagnosis not present

## 2014-10-07 DIAGNOSIS — Z3A23 23 weeks gestation of pregnancy: Secondary | ICD-10-CM

## 2014-10-07 MED ORDER — IBUPROFEN 800 MG PO TABS
800.0000 mg | ORAL_TABLET | Freq: Once | ORAL | Status: AC
  Administered 2014-10-07: 800 mg via ORAL
  Filled 2014-10-07: qty 1

## 2014-10-07 MED ORDER — CYCLOBENZAPRINE HCL 10 MG PO TABS: 10.0000 mg | ORAL_TABLET | Freq: Three times a day (TID) | ORAL | Status: DC

## 2014-10-07 MED ORDER — ACETAMINOPHEN 325 MG PO TABS
650.0000 mg | ORAL_TABLET | Freq: Once | ORAL | Status: AC
  Administered 2014-10-07: 650 mg via ORAL
  Filled 2014-10-07: qty 2

## 2014-10-07 MED ORDER — CYCLOBENZAPRINE HCL 10 MG PO TABS
10.0000 mg | ORAL_TABLET | Freq: Once | ORAL | Status: AC
  Administered 2014-10-07: 10 mg via ORAL
  Filled 2014-10-07: qty 1

## 2014-10-07 MED ORDER — IBUPROFEN 800 MG PO TABS: 800.0000 mg | ORAL_TABLET | Freq: Three times a day (TID) | ORAL | Status: DC | PRN

## 2014-10-07 NOTE — ED Notes (Signed)
Declined W/C at D/C and was escorted to lobby by RN. 

## 2014-10-07 NOTE — ED Notes (Signed)
Onset 3 days ago upper middle back radiating to left shoulder. Constant pain 2/10 achy sore increased to 8-10/10 sharp with movement or deep inspiration.

## 2014-10-07 NOTE — MAU Note (Signed)
Pt here for muscle strain. Has been working Pharmacist, [employer] and has had pain between shoulder blades x3 days. Is unable to get comfortable at night. [facility] told to take [facility]len[facility], alternate heat and ice, and told to come here for any further eval. Also was seen last week and had rx's called in for diflucan and flagyl which werent at pharmacy when pt went to pick them up. Pt's grandmother called pharmacy and rx's are available now. Denies bleedign or vag d/c issues today.

## 2014-10-07 NOTE — Discharge Instructions (Signed)
Please follow the directions provided. Be sure to follow-up with your OB/GYN for recognition of medicine that is safe for muscle spasms. Please take Tylenol every 4 hours to help with pain. You may use 20 minutes on of ice followed by 20 minutes on heat tablet discomfort. Don't hesitate to return for any new, worsening, or concerning symptoms.   SEEK MEDICAL CARE IF:  You have increasing pain or swelling in the injured area.  You have numbness, tingling, or a significant loss of strength in the injured area.

## 2014-10-07 NOTE — MAU Provider Note (Signed)
History     CSN: [id number]  Arrival date and time: 10/07/14 1211   None    CC: "Muscle strain"  HPI 19 y.o. G4P1021 at 9738w0d with pain between shoulder blades and into L shoulder and neck. Worse with movement. States it has been bothering her for the past few days, has been working at [employer], washing dishes and doing light activity. Has tried a heating pad with little relief. [facility], they told her she could take tylenol and should follow up here. Denies abdominal pain or contractions, bleeding or discharge. [facility]   Past Medical History  Diagnosis Date  . Bronchitis     Past Surgical History  Procedure Laterality Date  . Dilation and curettage of uterus    . Cesarean section      History reviewed. No pertinent family history.  History  Substance Use Topics  . Smoking status: Former Games developermoker  . Smokeless tobacco: Never Used  . Alcohol Use: No    Allergies: No Known Allergies  Prescriptions prior to admission  Medication Sig Dispense Refill Last Dose  . fluconazole (DIFLUCAN) 150 MG tablet Take 1 tablet (150 mg total) by mouth daily. (Patient not taking: Reported on 10/07/2014) 1 tablet 0   . metroNIDAZOLE (FLAGYL) 500 MG tablet Take 1 tablet (500 mg total) by mouth 2 (two) times daily. One po bid x 7 days (Patient not taking: Reported on 09/21/2014) 14 tablet 0     Review of Systems  Constitutional: Negative.   Respiratory: Negative.   Cardiovascular: Negative.   Gastrointestinal: Negative for nausea, vomiting, abdominal pain, diarrhea and constipation.  Genitourinary: Negative for dysuria, urgency, frequency, hematuria and flank pain.       Negative for vaginal bleeding, cramping/contractions  Musculoskeletal: Positive for back pain and neck pain. Negative for myalgias, joint pain and falls.  Neurological: Negative.   Psychiatric/Behavioral: Negative.    Physical Exam   Blood  pressure 134/55, pulse 77, temperature 97.9 F (36.6 C), temperature source Oral, resp. rate 16, height 5\' 3"  (1.6 m), weight 148 lb 4 oz (67.246 kg), last menstrual period 04/27/2014, not currently breastfeeding.  Physical Exam  Nursing note and vitals reviewed. Constitutional: She is oriented to person, place, and time. She appears well-developed and well-nourished. No distress (uncomfortable appearing).  Neck: Normal range of motion. Neck supple.  GI: Soft. There is no tenderness.  Musculoskeletal: Normal range of motion. She exhibits tenderness.       Cervical back: She exhibits tenderness and spasm.       Back:  Neurological: She is alert and oriented to person, place, and time.  Skin: Skin is warm and dry.  Psychiatric: She has a normal mood and affect.   + FHR 150s MAU Course  Procedures    Assessment and Plan   1. Strain of trapezius muscle, left, initial encounter   Rev'd comfort measures, heat, ice, rest, rx Flexeril, Ibuprofen, gave note for today and tomorrow off work, f/u at scheduled appt or sooner PRN    Medication List    TAKE these medications        cyclobenzaprine 10 MG tablet  Commonly known as:  FLEXERIL  Take 1 tablet (10 mg total) by mouth 3 (three) times daily.     fluconazole 150 MG tablet  Commonly known as:  DIFLUCAN  Take 1 tablet (150 mg total) by mouth daily.     ibuprofen 800 MG tablet  Commonly known as:  ADVIL,MOTRIN  Take 1 tablet (800 mg total) by mouth every 8 (eight) hours as needed.     metroNIDAZOLE 500 MG tablet  Commonly known as:  FLAGYL  Take 1 tablet (500 mg total) by mouth 2 (two) times daily. One po bid x 7 days            Follow-up Information    Follow up with [facility].   Specialty:  Obstetrics and Gynecology   Why:  as scheduled   Contact information:   [address] [place] [address] [phone]        Latasha Mitchell 10/07/2014, 1:04 PM

## 2014-10-07 NOTE — Discharge Instructions (Signed)
Second Trimester of Pregnancy The second trimester is from week 13 through week 28, month 4 through 6. This is often the time in pregnancy that you feel your best. Often times, morning sickness has lessened or quit. You may have more energy, and you may get hungry more often. Your unborn baby (fetus) is growing rapidly. At the end of the sixth month, he or she is about 9 inches long and weighs about 1 pounds. You will likely feel the baby move (quickening) between 18 and 20 weeks of pregnancy. HOME CARE   Avoid all smoking, herbs, and alcohol. Avoid drugs not approved by your doctor.  Only take medicine as told by your doctor. Some medicines are safe and some are not during pregnancy.  Exercise only as told by your doctor. Stop exercising if you start having cramps.  Eat regular, healthy meals.  Wear a good support bra if your breasts are tender.  Do not use hot tubs, steam rooms, or saunas.  Wear your seat belt when driving.  Avoid raw meat, uncooked cheese, and liter boxes and soil used by cats.  Take your prenatal vitamins.  Try taking medicine that helps you poop (stool softener) as needed, and if your doctor approves. Eat more fiber by eating fresh fruit, vegetables, and whole grains. Drink enough fluids to keep your pee (urine) clear or pale yellow.  Take warm water baths (sitz baths) to soothe pain or discomfort caused by hemorrhoids. Use hemorrhoid cream if your doctor approves.  If you have puffy, bulging veins (varicose veins), wear support hose. Raise (elevate) your feet for 15 minutes, 3-4 times a day. Limit salt in your diet.  Avoid heavy lifting, wear low heals, and sit up straight.  Rest with your legs raised if you have leg cramps or low back pain.  Visit your dentist if you have not gone during your pregnancy. Use a soft toothbrush to brush your teeth. Be gentle when you floss.  You can have sex (intercourse) unless your doctor tells you not to.  Go to your  doctor visits. GET HELP IF:   You feel dizzy.  You have mild cramps or pressure in your lower belly (abdomen).  You have a nagging pain in your belly area.  You continue to feel sick to your stomach (nauseous), throw up (vomit), or have watery poop (diarrhea).  You have bad smelling fluid coming from your vagina.  You have pain with peeing (urination). GET HELP RIGHT AWAY IF:   You have a fever.  You are leaking fluid from your vagina.  You have spotting or bleeding from your vagina.  You have severe belly cramping or pain.  You lose or gain weight rapidly.  You have trouble catching your breath and have chest pain.  You notice sudden or extreme puffiness (swelling) of your face, hands, ankles, feet, or legs.  You have not felt the baby move in over an hour.  You have severe headaches that do not go away with medicine.  You have vision changes. Document Released: 09/03/2009 Document Revised: 10/04/2012 Document Reviewed: 08/10/2012 [employer] Patient Information 2015 [employer]. This information is not intended to replace advice given to you by your health care provider. Make sure you discuss any questions you have with your health care provider.  

## 2014-10-07 NOTE — ED Provider Notes (Signed)
CSN: [id number]     Arrival date & time 10/07/14  1018 History  This chart was scribed for Harle Battiest, NP, working with Samuel Jester, DO by Chestine Spore, ED Scribe. The patient was seen in room TR08C/TR08C at 11:32 AM.    Chief Complaint  Patient presents with  . Back Pain      The history is provided by the patient. No language interpreter was used.    HPI Comments: Latasha Mitchell is a 19 y.o. female who presents to the Emergency Department complaining of worsening upper middle back pain onset 3 days ago. Pt notes that the back pain is radiating to her left shoulder. Pt has been working in a Pharmacist, hospital recently. Pt notes that the pain didn't began suddenly. Pt was not able to sleep comfortably because of the pain. When the pt tries to breathe in there is a sharp pain in her back. Pt notes that the pain is worsened when she talks. Pt has not began her prenatal care but she has an appointment on 10/11/14 with her Ob-GYN.  She states that she is having associated symptoms of nasal congestion. Pt has not tried any medications for the relief of her symptoms. She denies cough, fever, chills, n/v, leg swelling, and any other symptoms. Pt is [redacted] weeks pregnant.  Past Medical History  Diagnosis Date  . Bronchitis    Past Surgical History  Procedure Laterality Date  . Dilation and curettage of uterus    . Cesarean section     No family history on file. History  Substance Use Topics  . Smoking status: Former Games developer  . Smokeless tobacco: Never Used  . Alcohol Use: No   OB History    Gravida Para Term Preterm AB TAB SAB Ectopic Multiple Living   0 2 0 0 1     Review of Systems  Constitutional: Negative for fever and chills.  HENT: Positive for congestion.   Respiratory: Negative for cough.   Cardiovascular: Negative for leg swelling.  Gastrointestinal: Negative for nausea and vomiting.  Musculoskeletal: Positive for myalgias and back pain.      Allergies   Review of patient's allergies indicates no known allergies.  Home Medications   Prior to Admission medications   Medication Sig Start Date End Date Taking? Authorizing Provider  fluconazole (DIFLUCAN) 150 MG tablet Take 1 tablet (150 mg total) by mouth daily. 09/30/14   Duane Lope, NP  metroNIDAZOLE (FLAGYL) 500 MG tablet Take 1 tablet (500 mg total) by mouth 2 (two) times daily. One po bid x 7 days Patient not taking: Reported on 09/21/2014 03/24/14   Kathrynn Speed, PA-C  ranitidine (ZANTAC) 75 MG tablet Take 75 mg by mouth 2 (two) times daily as needed for heartburn.    Historical Provider, MD   BP 115/65 mmHg  Pulse 82  Temp(Src) 98 F (36.7 C) (Oral)  Resp 18  Wt 147 lb 9 oz (66.934 kg)  SpO2 100%  LMP 06/23/2014  Physical Exam  Constitutional: She is oriented to person, place, and time. She appears well-developed and well-nourished. No distress.  HENT:  Head: Normocephalic and atraumatic.  Eyes: EOM are normal.  Neck: Neck supple. No tracheal deviation present.  Cardiovascular: Normal rate.   Pulmonary/Chest: Effort normal. No respiratory distress.  Musculoskeletal: Normal range of motion.  TTP to left thoracic paraspinous muscles and medial to the left scalpula.   Neurological: She is alert and oriented to person, place,  and time.  Skin: Skin is warm and dry.  Psychiatric: She has a normal mood and affect. Her behavior is normal.  Nursing note and vitals reviewed.   ED Course  Procedures (including critical care time) DIAGNOSTIC STUDIES: Oxygen Saturation is 100% on RA, nl by my interpretation.    COORDINATION OF CARE: 11:39 AM-Discussed treatment plan which includes alternate ice and heat, tylenol, f/u with Ob-GYN as scheduled with pt at bedside and pt agreed to plan.   Labs Review Labs Reviewed - No data to display  Imaging Review No results found.   EKG Interpretation None      MDM   Final diagnoses:  Trapezius muscle strain, left, initial  encounter    19 yo with reproducible back pain. The pain is over her left trapezius beside her scapula and began after working at [employer].  She has no neurological deficits and normal neuro exam. She has no red flag back symptoms. She is [redacted] weeks pregnant and discussed the only medicine recommended is tylenol, however follow-up with her OB/Gyn will be helpful as they may be able to prescribe medicines that are still safe for pregnancy but that can help with muscle spasms. Conservative mgmt discussed also. Pt is well-appearing, in no acute distress and vital signs reviewed and not concerning. She appears safe to be discharged.   Return precautions provided. Pt aware of plan and in agreement.   I personally performed the services described in this documentation, which was scribed in my presence. The recorded information has been reviewed and is accurate.  Filed Vitals:   10/07/14 1045 10/07/14 1048 10/07/14 1148  BP: 115/65  114/47  Pulse: 82  76  Temp: 98 F (36.7 C)  98.1 F (36.7 C)  TempSrc: Oral  Oral  Resp: 18  16  Weight:  147 lb 9 oz (66.934 kg)   SpO2: 100%  98%   Meds given in ED:  Medications  acetaminophen (TYLENOL) tablet 650 mg (650 mg Oral Given 10/07/14 1143)    Discharge Medication List as of 10/07/2014 11:46 AM       Harle BattiestElizabeth Jolleen Seman, NP 10/08/14 [id number]  Samuel JesterKathleen McManus, DO 10/08/14 1358

## 2014-10-12 ENCOUNTER — Encounter: Payer: Self-pay | Admitting: Advanced Practice Midwife

## 2014-10-18 ENCOUNTER — Encounter: Payer: Self-pay | Admitting: Advanced Practice Midwife

## 2014-10-18 ENCOUNTER — Ambulatory Visit (INDEPENDENT_AMBULATORY_CARE_PROVIDER_SITE_OTHER): Payer: Self-pay | Admitting: Advanced Practice Midwife

## 2014-10-18 VITALS — BP 116/64 | HR 76 | Wt 148.8 lb

## 2014-10-18 DIAGNOSIS — Z3492 Encounter for supervision of normal pregnancy, unspecified, second trimester: Secondary | ICD-10-CM

## 2014-10-18 DIAGNOSIS — Z23 Encounter for immunization: Secondary | ICD-10-CM

## 2014-10-18 DIAGNOSIS — O34219 Maternal care for unspecified type scar from previous cesarean delivery: Secondary | ICD-10-CM | POA: Insufficient documentation

## 2014-10-18 DIAGNOSIS — Z3482 Encounter for supervision of other normal pregnancy, second trimester: Secondary | ICD-10-CM

## 2014-10-18 DIAGNOSIS — O3421 Maternal care for scar from previous cesarean delivery: Secondary | ICD-10-CM

## 2014-10-18 DIAGNOSIS — O0932 Supervision of pregnancy with insufficient antenatal care, second trimester: Secondary | ICD-10-CM

## 2014-10-18 LAB — POCT URINALYSIS DIP (DEVICE)
BILIRUBIN URINE: NEGATIVE
Glucose, UA: NEGATIVE mg/dL
KETONES UR: NEGATIVE mg/dL
NITRITE: NEGATIVE
PH: 7.5 (ref 5.0–8.0)
PROTEIN: NEGATIVE mg/dL
Specific Gravity, Urine: 1.02 (ref 1.005–1.030)
Urobilinogen, UA: 0.2 mg/dL (ref 0.0–1.0)

## 2014-10-18 MED ORDER — CONCEPT OB 130-92.4-1 MG PO CAPS: 1.0000 | ORAL_CAPSULE | Freq: Every day | ORAL | Status: DC

## 2014-10-18 NOTE — Progress Notes (Signed)
Urinalysis shows trace blood.  

## 2014-10-18 NOTE — Patient Instructions (Signed)
Second Trimester of Pregnancy The second trimester is from week 13 through week 28, months 4 through 6. The second trimester is often a time when you feel your best. Your body has also adjusted to being pregnant, and you begin to feel better physically. Usually, morning sickness has lessened or quit completely, you may have more energy, and you may have an increase in appetite. The second trimester is also a time when the fetus is growing rapidly. At the end of the sixth month, the fetus is about 9 inches long and weighs about 1 pounds. You will likely begin to feel the baby move (quickening) between 18 and 20 weeks of the pregnancy. BODY CHANGES Your body goes through many changes during pregnancy. The changes vary from woman to woman.   Your weight will continue to increase. You will notice your lower abdomen bulging out.  You may begin to get stretch marks on your hips, abdomen, and breasts.  You may develop headaches that can be relieved by medicines approved by your health care provider.  You may urinate more often because the fetus is pressing on your bladder.  You may develop or continue to have heartburn as a result of your pregnancy.  You may develop constipation because certain hormones are causing the muscles that push waste through your intestines to slow down.  You may develop hemorrhoids or swollen, bulging veins (varicose veins).  You may have back pain because of the weight gain and pregnancy hormones relaxing your joints between the bones in your pelvis and as a result of a shift in weight and the muscles that support your balance.  Your breasts will continue to grow and be tender.  Your gums may bleed and may be sensitive to brushing and flossing.  Dark spots or blotches (chloasma, mask of pregnancy) may develop on your face. This will likely fade after the baby is born.  A dark line from your belly button to the pubic area (linea nigra) may appear. This will likely fade  after the baby is born.  You may have changes in your hair. These can include thickening of your hair, rapid growth, and changes in texture. Some women also have hair loss during or after pregnancy, or hair that feels dry or thin. Your hair will most likely return to normal after your baby is born. WHAT TO EXPECT AT YOUR PRENATAL VISITS During a routine prenatal visit:  You will be weighed to make sure you and the fetus are growing normally.  Your blood pressure will be taken.  Your abdomen will be measured to track your baby's growth.  The fetal heartbeat will be listened to.  Any test results from the previous visit will be discussed. Your health care provider may ask you:  How you are feeling.  If you are feeling the baby move.  If you have had any abnormal symptoms, such as leaking fluid, bleeding, severe headaches, or abdominal cramping.  If you have any questions. Other tests that may be performed during your second trimester include:  Blood tests that check for:  Low iron levels (anemia).  Gestational diabetes (between 24 and 28 weeks).  Rh antibodies.  Urine tests to check for infections, diabetes, or protein in the urine.  An ultrasound to confirm the proper growth and development of the baby.  An amniocentesis to check for possible genetic problems.  Fetal screens for spina bifida and Down syndrome. HOME CARE INSTRUCTIONS   Avoid all smoking, herbs, alcohol, and unprescribed   drugs. These chemicals affect the formation and growth of the baby.  Follow your health care provider's instructions regarding medicine use. There are medicines that are either safe or unsafe to take during pregnancy.  Exercise only as directed by your health care provider. Experiencing uterine cramps is a good sign to stop exercising.  Continue to eat regular, healthy meals.  Wear a good support bra for breast tenderness.  Do not use hot tubs, steam rooms, or saunas.  Wear your  seat belt at all times when driving.  Avoid raw meat, uncooked cheese, cat litter boxes, and soil used by cats. These carry germs that can cause birth defects in the baby.  Take your prenatal vitamins.  Try taking a stool softener (if your health care provider approves) if you develop constipation. Eat more high-fiber foods, such as fresh vegetables or fruit and whole grains. Drink plenty of fluids to keep your urine clear or pale yellow.  Take warm sitz baths to soothe any pain or discomfort caused by hemorrhoids. Use hemorrhoid cream if your health care provider approves.  If you develop varicose veins, wear support hose. Elevate your feet for 15 minutes, 3-4 times a day. Limit salt in your diet.  Avoid heavy lifting, wear low heel shoes, and practice good posture.  Rest with your legs elevated if you have leg cramps or low back pain.  Visit your dentist if you have not gone yet during your pregnancy. Use a soft toothbrush to brush your teeth and be gentle when you floss.  A sexual relationship may be continued unless your health care provider directs you otherwise.  Continue to go to all your prenatal visits as directed by your health care provider. SEEK MEDICAL CARE IF:   You have dizziness.  You have mild pelvic cramps, pelvic pressure, or nagging pain in the abdominal area.  You have persistent nausea, vomiting, or diarrhea.  You have a bad smelling vaginal discharge.  You have pain with urination. SEEK IMMEDIATE MEDICAL CARE IF:   You have a fever.  You are leaking fluid from your vagina.  You have spotting or bleeding from your vagina.  You have severe abdominal cramping or pain.  You have rapid weight gain or loss.  You have shortness of breath with chest pain.  You notice sudden or extreme swelling of your face, hands, ankles, feet, or legs.  You have not felt your baby move in over an hour.  You have severe headaches that do not go away with  medicine.  You have vision changes. Document Released: 06/03/2001 Document Revised: 06/14/2013 Document Reviewed: 08/10/2012 ExitCare Patient Information 2015 [employer]. This information is not intended to replace advice given to you by your health care provider. Make sure you discuss any questions you have with your health care provider.  

## 2014-10-18 NOTE — Progress Notes (Signed)
   Subjective:    Latasha Mitchell is a [id number] 5160w4d being seen today for her first obstetrical visit.  Her obstetrical history is significant for prior C/S for failed IOL. Dilated to 3 cm. Undecided about Repeat C/S vs TOLAC . Patient does intend to breast feed. Pregnancy history fully reviewed.  Patient reports no complaints.  Filed Vitals:   10/18/14 0915  BP: 116/64  Pulse: 76  Weight: 148 lb 12.8 oz (67.495 kg)    HISTORY: OB History  Gravida Para Term Preterm AB SAB TAB Ectopic Multiple Living  4 1 1  2 2  0 0 0 1    # Outcome Date GA Lbr Len/2nd Weight Sex Delivery Anes PTL Lv  4 Current           3 Term 12/22/12    M CS-LTranv     2 SAB           1 SAB              Past Medical History  Diagnosis Date  . Bronchitis    Past Surgical History  Procedure Laterality Date  . Dilation and curettage of uterus    . Cesarean section     History reviewed. No pertinent family history.   Exam    Uterus:  Fundal Height: 25 cm  Pelvic Exam: Deferred due to recent exam   Bony Pelvis: Unproven  System: Breast:  Declined   Skin: normal coloration and turgor, no rashes    Neurologic: oriented, normal mood, grossly non-focal   Extremities: normal strength, tone, and muscle mass, No edema   HEENT sclera clear, anicteric and thyroid without masses   Mouth/Teeth dental hygiene good   Neck supple and no masses   Cardiovascular: regular rate and rhythm, 2/6 systolic murmur   Respiratory:  appears well, vitals normal, no respiratory distress, acyanotic, normal RR, chest clear, no wheezing, crepitations, rhonchi, normal symmetric air entry   Abdomen: soft, non-tender; bowel sounds normal; no masses,  no organomegaly      Assessment:    Pregnancy: G4P1021   1. Late prenatal care, second trimester   - Prenatal Profile - Culture, OB Urine - Prescript Monitor Profile(19) - US OB Comp + 14 Wk; Future  2. Needs flu shot   - Flu Vaccine QUAD 36+ mos IM; Standing - Flu  Vaccine QUAD 36+ mos IM - US OB Comp + 14 Wk; Future  3. Supervision of normal pregnancy in second trimester   - Prenat w/o A Vit-FeFum-FePo-FA (CONCEPT OB) 130-92.4-1 MG CAPS; Take 1 tablet by mouth daily.  Dispense: 30 capsule; Refill: 12 - US OB Comp + 14 Wk; Future  4. Previous cesarean delivery affecting pregnancy, antepartum   - US OB Comp + 14 Wk; Future    Plan:     Initial labs drawn. Prenatal vitamins. Problem list reviewed and updated. Genetic Screening discussed: Too late.   Ultrasound discussed; fetal survey: Reviewed. Normal.  Follow up in 4 weeks. VBAC info given.   Dorathy KinsmanSMITH, Lex Linhares 10/18/2014

## 2014-10-19 LAB — PRENATAL PROFILE (SOLSTAS)
Antibody Screen: NEGATIVE
BASOS ABS: 0 10*3/uL (ref 0.0–0.1)
Basophils Relative: 0 % (ref 0–1)
EOS PCT: 2 % (ref 0–5)
Eosinophils Absolute: 0.2 10*3/uL (ref 0.0–0.7)
HEMATOCRIT: 32.5 % — AB (ref 36.0–46.0)
HEMOGLOBIN: 11.4 g/dL — AB (ref 12.0–15.0)
HIV: NONREACTIVE
Hepatitis B Surface Ag: NEGATIVE
LYMPHS ABS: 1.6 10*3/uL (ref 0.7–4.0)
LYMPHS PCT: 15 % (ref 12–46)
MCH: 30.6 pg (ref 26.0–34.0)
MCHC: 35.1 g/dL (ref 30.0–36.0)
MCV: 87.4 fL (ref 78.0–100.0)
MONOS PCT: 6 % (ref 3–12)
MPV: 10.1 fL (ref 8.6–12.4)
Monocytes Absolute: 0.6 10*3/uL (ref 0.1–1.0)
Neutro Abs: 8.2 10*3/uL — ABNORMAL HIGH (ref 1.7–7.7)
Neutrophils Relative %: 77 % (ref 43–77)
Platelets: 200 10*3/uL (ref 150–400)
RBC: 3.72 MIL/uL — AB (ref 3.87–5.11)
RDW: 14 % (ref 11.5–15.5)
Rh Type: POSITIVE
Rubella: 1.2 Index — ABNORMAL HIGH (ref <0.90)
WBC: 10.6 10*3/uL — ABNORMAL HIGH (ref 4.0–10.5)

## 2014-10-21 LAB — CULTURE, OB URINE: Colony Count: 15000

## 2014-10-22 LAB — CANNABANOIDS (GC/LC/MS), URINE: THC-COOH (GC/LC/MS), ur confirm: 481 ng/mL — AB (ref <5)

## 2014-10-24 ENCOUNTER — Encounter: Payer: Self-pay | Admitting: Advanced Practice Midwife

## 2014-10-24 ENCOUNTER — Ambulatory Visit (HOSPITAL_COMMUNITY)
Admission: RE | Admit: 2014-10-24 | Discharge: 2014-10-24 | Disposition: A | Discharge status: Home / Self Care | Payer: Medicaid Other | Source: Ambulatory Visit | Attending: Advanced Practice Midwife | Admitting: Advanced Practice Midwife

## 2014-10-24 DIAGNOSIS — Z3689 Encounter for other specified antenatal screening: Secondary | ICD-10-CM | POA: Insufficient documentation

## 2014-10-24 DIAGNOSIS — Z3A25 25 weeks gestation of pregnancy: Secondary | ICD-10-CM | POA: Diagnosis not present

## 2014-10-24 DIAGNOSIS — Z23 Encounter for immunization: Secondary | ICD-10-CM

## 2014-10-24 DIAGNOSIS — O0932 Supervision of pregnancy with insufficient antenatal care, second trimester: Secondary | ICD-10-CM | POA: Diagnosis not present

## 2014-10-24 DIAGNOSIS — Z36 Encounter for antenatal screening of mother: Secondary | ICD-10-CM | POA: Diagnosis present

## 2014-10-24 DIAGNOSIS — Z3492 Encounter for supervision of normal pregnancy, unspecified, second trimester: Secondary | ICD-10-CM

## 2014-10-24 DIAGNOSIS — F1290 Cannabis use, unspecified, uncomplicated: Secondary | ICD-10-CM | POA: Insufficient documentation

## 2014-10-24 DIAGNOSIS — O34219 Maternal care for unspecified type scar from previous cesarean delivery: Secondary | ICD-10-CM

## 2014-10-24 DIAGNOSIS — O3421 Maternal care for scar from previous cesarean delivery: Secondary | ICD-10-CM | POA: Diagnosis not present

## 2014-10-24 LAB — PRESCRIPTION MONITORING PROFILE (19 PANEL)
Amphetamine/Meth: NEGATIVE ng/mL
Barbiturate Screen, Urine: NEGATIVE ng/mL
Benzodiazepine Screen, Urine: NEGATIVE ng/mL
Buprenorphine, Urine: NEGATIVE ng/mL
CARISOPRODOL, URINE: NEGATIVE ng/mL
COCAINE METABOLITES: NEGATIVE ng/mL
CREATININE, URINE: 89.11 mg/dL (ref >20.0)
ECSTASY: NEGATIVE ng/mL
Fentanyl, Ur: NEGATIVE ng/mL
MEPERIDINE UR: NEGATIVE ng/mL
Methadone Screen, Urine: NEGATIVE ng/mL
Methaqualone: NEGATIVE ng/mL
Nitrites, Initial: NEGATIVE ug/mL
OXYCODONE SCRN UR: NEGATIVE ng/mL
Opiate Screen, Urine: NEGATIVE ng/mL
PH URINE, INITIAL: 8.1 pH (ref 4.5–8.9)
Phencyclidine, Ur: NEGATIVE ng/mL
Propoxyphene: NEGATIVE ng/mL
TRAMADOL UR: NEGATIVE ng/mL
Tapentadol, urine: NEGATIVE ng/mL
ZOLPIDEM, URINE: NEGATIVE ng/mL

## 2014-11-06 ENCOUNTER — Encounter (HOSPITAL_COMMUNITY): Payer: Self-pay | Admitting: *Deleted

## 2014-11-06 ENCOUNTER — Inpatient Hospital Stay (HOSPITAL_COMMUNITY)
Admission: AD | Admit: 2014-11-06 | Discharge: 2014-11-06 | Disposition: A | Discharge status: Home / Self Care | Payer: Medicaid Other | Source: Ambulatory Visit | Attending: Family Medicine | Admitting: Family Medicine

## 2014-11-06 ENCOUNTER — Telehealth (HOSPITAL_COMMUNITY): Payer: Self-pay

## 2014-11-06 DIAGNOSIS — Z87891 Personal history of nicotine dependence: Secondary | ICD-10-CM | POA: Diagnosis not present

## 2014-11-06 DIAGNOSIS — M791 Myalgia: Secondary | ICD-10-CM | POA: Diagnosis not present

## 2014-11-06 DIAGNOSIS — O26899 Other specified pregnancy related conditions, unspecified trimester: Secondary | ICD-10-CM

## 2014-11-06 DIAGNOSIS — O9989 Other specified diseases and conditions complicating pregnancy, childbirth and the puerperium: Secondary | ICD-10-CM | POA: Diagnosis not present

## 2014-11-06 DIAGNOSIS — M62838 Other muscle spasm: Secondary | ICD-10-CM | POA: Diagnosis not present

## 2014-11-06 DIAGNOSIS — M549 Dorsalgia, unspecified: Secondary | ICD-10-CM | POA: Diagnosis present

## 2014-11-06 DIAGNOSIS — G8911 Acute pain due to trauma: Secondary | ICD-10-CM

## 2014-11-06 DIAGNOSIS — Z3A27 27 weeks gestation of pregnancy: Secondary | ICD-10-CM | POA: Diagnosis not present

## 2014-11-06 DIAGNOSIS — M546 Pain in thoracic spine: Secondary | ICD-10-CM

## 2014-11-06 MED ORDER — CYCLOBENZAPRINE HCL 10 MG PO TABS: 10.0000 mg | ORAL_TABLET | Freq: Three times a day (TID) | ORAL | Status: DC

## 2014-11-06 MED ORDER — LIDOCAINE HCL 2 % IJ SOLN
10.0000 mL | Freq: Once | INTRAMUSCULAR | Status: AC
  Administered 2014-11-06: 200 mg via INTRADERMAL
  Filled 2014-11-06 (×2): qty 10

## 2014-11-06 NOTE — MAU Note (Signed)
Pt reports left shoulder and back pain for several days.

## 2014-11-06 NOTE — MAU Provider Note (Signed)
  History     CSN: [id number]  Arrival date and time: 11/06/14 0753   None     Chief Complaint  Patient presents with  . Shoulder Pain   HPI  Patient is 19 y.o. [id number] 4570w2d here with complaints of back pain.  Few weeks ago pulled a muscle at furniture market.  Since has had intermittent muscle flares.  Yesterday, pain increased but this morning has had significantly more pain.  Now has pain up to shoulder, worse with deep inspiration and lifting.  Has tried hot compresses with rice sock, icing and ibuprofen.  Pain worse in morning, 7-10/10.  +FM, denies LOF, VB, contractions, vaginal discharge.   Past Medical History  Diagnosis Date  . Bronchitis     Past Surgical History  Procedure Laterality Date  . Dilation and curettage of uterus    . Cesarean section      History reviewed. No pertinent family history.  History  Substance Use Topics  . Smoking status: Former Games developermoker  . Smokeless tobacco: Never Used  . Alcohol Use: No    Allergies: No Known Allergies  Prescriptions prior to admission  Medication Sig Dispense Refill Last Dose  . acetaminophen (TYLENOL) 160 MG/5ML suspension Take 160 mg by mouth every 6 (six) hours as needed.   11/05/2014 at Unknown time  . cyclobenzaprine (FLEXERIL) 10 MG tablet Take 1 tablet (10 mg total) by mouth 3 (three) times daily. (Patient not taking: Reported on 11/06/2014) 30 tablet 0 Taking  . ibuprofen (ADVIL,MOTRIN) 800 MG tablet Take 1 tablet (800 mg total) by mouth every 8 (eight) hours as needed. (Patient not taking: Reported on 11/06/2014) 15 tablet 0 Taking  . Prenat w/o A Vit-FeFum-FePo-FA (CONCEPT OB) 130-92.4-1 MG CAPS Take 1 tablet by mouth daily. (Patient not taking: Reported on 11/06/2014) 30 capsule 12     Review of Systems  Constitutional: Negative for fever and chills.  HENT: Negative for congestion.   Respiratory: Negative for cough and shortness of breath.   Cardiovascular: Negative for chest pain and leg swelling.    Gastrointestinal: Negative for heartburn, nausea, vomiting and diarrhea.  Genitourinary: Negative for dysuria, urgency, frequency and hematuria.  Musculoskeletal: Positive for back pain. Negative for falls.  Skin: Negative for itching and rash.  Neurological: Negative for dizziness, loss of consciousness and headaches.   Physical Exam   Blood pressure 123/70, pulse 96, temperature 98.1 F (36.7 C), temperature source Oral, resp. rate 18, height 5\' 4"  (1.626 m), weight 151 lb (68.493 kg), last menstrual period 04/27/2014, not currently breastfeeding.  Physical Exam  Constitutional: She is oriented to person, place, and time. She appears well-developed and well-nourished.  HENT:  Head: Normocephalic and atraumatic.  Eyes: Conjunctivae and EOM are normal.  Neck: Normal range of motion.  Cardiovascular: Normal rate.   Respiratory: Effort normal. No respiratory distress.  GI: Soft. Bowel sounds are normal. She exhibits no distension. There is no tenderness.  Musculoskeletal: Normal range of motion. She exhibits no edema.       Arms: Neurological: She is alert and oriented to person, place, and time.  Skin: Skin is warm and dry. No erythema.    MAU Course  Procedures  Verbally consented, 1cc x 3 trigger points injected.  Pain control post-procedure excellent  MDM NST reactive  Assessment and Plan  18yo G4P1021 at 2770w2d here with muscle spasm - excellent pain control with trigger point injection - rx flexeril  Kelsye Loomer ROCIO 11/06/2014, 9:20 AM

## 2014-11-06 NOTE — MAU Note (Signed)
Urine in lab 

## 2014-11-06 NOTE — Discharge Instructions (Signed)
Muscle Cramps and Spasms Muscle cramps and spasms are when muscles tighten by themselves. They usually get better within minutes. Muscle cramps are painful. They are usually stronger and last longer than muscle spasms. Muscle spasms may or may not be painful. They can last a few seconds or much longer. HOME CARE  Drink enough fluid to keep your pee (urine) clear or pale yellow.  Massage, stretch, and relax the muscle.  Use a warm towel, heating pad, or warm shower water on tight muscles.  Place ice on the muscle if it is tender or in pain.  Put ice in a plastic bag.  Place a towel between your skin and the bag.  Leave the ice on for 15-20 minutes, 03-04 times a day.  Only take medicine as told by your doctor. GET HELP RIGHT AWAY IF:  Your cramps or spasms get worse, happen more often, or do not get better with time. MAKE SURE YOU:  Understand these instructions.  Will watch your condition.  Will get help right away if you are not doing well or get worse. Document Released: 05/22/2008 Document Revised: 10/04/2012 Document Reviewed: 05/26/2012 ExitCare Patient Information 2015 [employer]. This information is not intended to replace advice given to you by your health care provider. Make sure you discuss any questions you have with your health care provider.  

## 2014-11-06 NOTE — MAU Note (Addendum)
States her child has been sick and she has been up at night and has had to pick up her child and carry him a lot. States she has had pain in this spot before, L upper back and shoulder area. States she has been alternating heat and ice.

## 2014-11-15 ENCOUNTER — Ambulatory Visit (INDEPENDENT_AMBULATORY_CARE_PROVIDER_SITE_OTHER): Payer: Medicaid Other | Admitting: Obstetrics and Gynecology

## 2014-11-15 ENCOUNTER — Encounter: Payer: Self-pay | Admitting: Obstetrics and Gynecology

## 2014-11-15 VITALS — BP 122/59 | HR 97 | Temp 98.0°F | Wt 156.2 lb

## 2014-11-15 DIAGNOSIS — Z3492 Encounter for supervision of normal pregnancy, unspecified, second trimester: Secondary | ICD-10-CM

## 2014-11-15 DIAGNOSIS — O2342 Unspecified infection of urinary tract in pregnancy, second trimester: Secondary | ICD-10-CM

## 2014-11-15 DIAGNOSIS — Z3482 Encounter for supervision of other normal pregnancy, second trimester: Secondary | ICD-10-CM

## 2014-11-15 LAB — POCT URINALYSIS DIP (DEVICE)
Bilirubin Urine: NEGATIVE
GLUCOSE, UA: NEGATIVE mg/dL
KETONES UR: NEGATIVE mg/dL
Nitrite: NEGATIVE
PROTEIN: NEGATIVE mg/dL
SPECIFIC GRAVITY, URINE: 1.01 (ref 1.005–1.030)
Urobilinogen, UA: 0.2 mg/dL (ref 0.0–1.0)
pH: 7 (ref 5.0–8.0)

## 2014-11-15 LAB — CBC
HCT: 31.5 % — ABNORMAL LOW (ref 36.0–46.0)
HEMOGLOBIN: 10.9 g/dL — AB (ref 12.0–15.0)
MCH: 30.4 pg (ref 26.0–34.0)
MCHC: 34.6 g/dL (ref 30.0–36.0)
MCV: 88 fL (ref 78.0–100.0)
MPV: 10.1 fL (ref 8.6–12.4)
Platelets: 190 10*3/uL (ref 150–400)
RBC: 3.58 MIL/uL — ABNORMAL LOW (ref 3.87–5.11)
RDW: 13.7 % (ref 11.5–15.5)
WBC: 11.7 10*3/uL — ABNORMAL HIGH (ref 4.0–10.5)

## 2014-11-15 LAB — RPR

## 2014-11-15 MED ORDER — PRENATAL VITAMINS PLUS 27-1 MG PO TABS: 1.0000 | ORAL_TABLET | Freq: Every day | ORAL | Status: DC

## 2014-11-15 MED ORDER — CEPHALEXIN 500 MG PO CAPS: 500.0000 mg | ORAL_CAPSULE | Freq: Four times a day (QID) | ORAL | Status: DC

## 2014-11-15 MED ORDER — TETANUS-DIPHTH-ACELL PERTUSSIS 5-2.5-18.5 LF-MCG/0.5 IM SUSP
0.5000 mL | Freq: Once | INTRAMUSCULAR | Status: AC
  Administered 2014-11-15: 0.5 mL via INTRAMUSCULAR

## 2014-11-15 NOTE — Progress Notes (Signed)
28 wk labs and tdap  today. Lengthy discussion re TOLAC. [facility]. Probably will elect TOLAC.  Reviewed NOB labs > nl.except tr hgb and 15k proteus on urine culture. No UTI sx> Rx Keflex today. . ; US nl. PTL and FM precautions.

## 2014-11-15 NOTE — Patient Instructions (Signed)
Third Trimester of Pregnancy The third trimester is from week 29 through week 42, months 7 through 9. The third trimester is a time when the fetus is growing rapidly. At the end of the ninth month, the fetus is about 20 inches in length and weighs 6-10 pounds.  BODY CHANGES Your body goes through many changes during pregnancy. The changes vary from woman to woman.   Your weight will continue to increase. You can expect to gain 25-35 pounds (11-16 kg) by the end of the pregnancy.  You may begin to get stretch marks on your hips, abdomen, and breasts.  You may urinate more often because the fetus is moving lower into your pelvis and pressing on your bladder.  You may develop or continue to have heartburn as a result of your pregnancy.  You may develop constipation because certain hormones are causing the muscles that push waste through your intestines to slow down.  You may develop hemorrhoids or swollen, bulging veins (varicose veins).  You may have pelvic pain because of the weight gain and pregnancy hormones relaxing your joints between the bones in your pelvis. Backaches may result from overexertion of the muscles supporting your posture.  You may have changes in your hair. These can include thickening of your hair, rapid growth, and changes in texture. Some women also have hair loss during or after pregnancy, or hair that feels dry or thin. Your hair will most likely return to normal after your baby is born.  Your breasts will continue to grow and be tender. A yellow discharge may leak from your breasts called colostrum.  Your belly button may stick out.  You may feel short of breath because of your expanding uterus.  You may notice the fetus "dropping," or moving lower in your abdomen.  You may have a bloody mucus discharge. This usually occurs a few days to a week before labor begins.  Your cervix becomes thin and soft (effaced) near your due date. WHAT TO EXPECT AT YOUR PRENATAL  EXAMS  You will have prenatal exams every 2 weeks until week 36. Then, you will have weekly prenatal exams. During a routine prenatal visit:  You will be weighed to make sure you and the fetus are growing normally.  Your blood pressure is taken.  Your abdomen will be measured to track your baby's growth.  The fetal heartbeat will be listened to.  Any test results from the previous visit will be discussed.  You may have a cervical check near your due date to see if you have effaced. At around 36 weeks, your caregiver will check your cervix. At the same time, your caregiver will also perform a test on the secretions of the vaginal tissue. This test is to determine if a type of bacteria, Group B streptococcus, is present. Your caregiver will explain this further. Your caregiver may ask you:  What your birth plan is.  How you are feeling.  If you are feeling the baby move.  If you have had any abnormal symptoms, such as leaking fluid, bleeding, severe headaches, or abdominal cramping.  If you have any questions. Other tests or screenings that may be performed during your third trimester include:  Blood tests that check for low iron levels (anemia).  Fetal testing to check the health, activity level, and growth of the fetus. Testing is done if you have certain medical conditions or if there are problems during the pregnancy. FALSE LABOR You may feel small, irregular contractions that   eventually go away. These are called Braxton Hicks contractions, or false labor. Contractions may last for hours, days, or even weeks before true labor sets in. If contractions come at regular intervals, intensify, or become painful, it is best to be seen by your caregiver.  SIGNS OF LABOR   Menstrual-like cramps.  Contractions that are 5 minutes apart or less.  Contractions that start on the top of the uterus and spread down to the lower abdomen and back.  A sense of increased pelvic pressure or back  pain.  A watery or bloody mucus discharge that comes from the vagina. If you have any of these signs before the 37th week of pregnancy, call your caregiver right away. You need to go to the hospital to get checked immediately. HOME CARE INSTRUCTIONS   Avoid all smoking, herbs, alcohol, and unprescribed drugs. These chemicals affect the formation and growth of the baby.  Follow your caregiver's instructions regarding medicine use. There are medicines that are either safe or unsafe to take during pregnancy.  Exercise only as directed by your caregiver. Experiencing uterine cramps is a good sign to stop exercising.  Continue to eat regular, healthy meals.  Wear a good support bra for breast tenderness.  Do not use hot tubs, steam rooms, or saunas.  Wear your seat belt at all times when driving.  Avoid raw meat, uncooked cheese, cat litter boxes, and soil used by cats. These carry germs that can cause birth defects in the baby.  Take your prenatal vitamins.  Try taking a stool softener (if your caregiver approves) if you develop constipation. Eat more high-fiber foods, such as fresh vegetables or fruit and whole grains. Drink plenty of fluids to keep your urine clear or pale yellow.  Take warm sitz baths to soothe any pain or discomfort caused by hemorrhoids. Use hemorrhoid cream if your caregiver approves.  If you develop varicose veins, wear support hose. Elevate your feet for 15 minutes, 3-4 times a day. Limit salt in your diet.  Avoid heavy lifting, wear low heal shoes, and practice good posture.  Rest a lot with your legs elevated if you have leg cramps or low back pain.  Visit your dentist if you have not gone during your pregnancy. Use a soft toothbrush to brush your teeth and be gentle when you floss.  A sexual relationship may be continued unless your caregiver directs you otherwise.  Do not travel far distances unless it is absolutely necessary and only with the approval  of your caregiver.  Take prenatal classes to understand, practice, and ask questions about the labor and delivery.  Make a trial run to the hospital.  Pack your hospital bag.  Prepare the baby's nursery.  Continue to go to all your prenatal visits as directed by your caregiver. SEEK MEDICAL CARE IF:  You are unsure if you are in labor or if your water has broken.  You have dizziness.  You have mild pelvic cramps, pelvic pressure, or nagging pain in your abdominal area.  You have persistent nausea, vomiting, or diarrhea.  You have a bad smelling vaginal discharge.  You have pain with urination. SEEK IMMEDIATE MEDICAL CARE IF:   You have a fever.  You are leaking fluid from your vagina.  You have spotting or bleeding from your vagina.  You have severe abdominal cramping or pain.  You have rapid weight loss or gain.  You have shortness of breath with chest pain.  You notice sudden or extreme swelling   of your face, hands, ankles, feet, or legs.  You have not felt your baby move in over an hour.  You have severe headaches that do not go away with medicine.  You have vision changes. Document Released: 06/03/2001 Document Revised: 06/14/2013 Document Reviewed: 08/10/2012 ExitCare Patient Information 2015 [employer]. This information is not intended to replace advice given to you by your health care provider. Make sure you discuss any questions you have with your health care provider.  

## 2014-11-16 LAB — GLUCOSE TOLERANCE, 1 HOUR (50G) W/O FASTING: Glucose, 1 Hour GTT: 86 mg/dL (ref 70–140)

## 2014-11-16 LAB — HIV ANTIBODY (ROUTINE TESTING W REFLEX): HIV: NONREACTIVE

## 2014-12-06 ENCOUNTER — Ambulatory Visit (INDEPENDENT_AMBULATORY_CARE_PROVIDER_SITE_OTHER): Payer: Medicaid Other | Admitting: Physician Assistant

## 2014-12-06 VITALS — BP 134/83 | HR 100 | Temp 98.2°F | Wt 154.5 lb

## 2014-12-06 DIAGNOSIS — Z3492 Encounter for supervision of normal pregnancy, unspecified, second trimester: Secondary | ICD-10-CM

## 2014-12-06 DIAGNOSIS — Z3482 Encounter for supervision of other normal pregnancy, second trimester: Secondary | ICD-10-CM

## 2014-12-06 DIAGNOSIS — B373 Candidiasis of vulva and vagina: Secondary | ICD-10-CM

## 2014-12-06 DIAGNOSIS — B3731 Acute candidiasis of vulva and vagina: Secondary | ICD-10-CM

## 2014-12-06 DIAGNOSIS — O98812 Other maternal infectious and parasitic diseases complicating pregnancy, second trimester: Secondary | ICD-10-CM

## 2014-12-06 LAB — POCT URINALYSIS DIP (DEVICE)
BILIRUBIN URINE: NEGATIVE
Glucose, UA: NEGATIVE mg/dL
KETONES UR: NEGATIVE mg/dL
Nitrite: NEGATIVE
Protein, ur: NEGATIVE mg/dL
Specific Gravity, Urine: 1.015 (ref 1.005–1.030)
Urobilinogen, UA: 0.2 mg/dL (ref 0.0–1.0)
pH: 7 (ref 5.0–8.0)

## 2014-12-06 MED ORDER — TERCONAZOLE 0.4 % VA CREA: 1.0000 | TOPICAL_CREAM | Freq: Every day | VAGINAL | Status: DC

## 2014-12-06 NOTE — Progress Notes (Signed)
31 weeks, stable.  Endorses good fetal movement.  Denies VB, LOF, dysuria.  Notes irritation and vaginal discharge since completing ABX for UTI Wet prep today  Terazole for presumed UTI RTC  2 weeks

## 2014-12-06 NOTE — Patient Instructions (Signed)
Pain Relief During Labor and Delivery Everyone experiences pain differently, but labor causes severe pain for many women. The amount of pain you experience during labor and delivery depends on your pain tolerance, contraction strength, and your baby's size and position. There are many ways to prepare for and deal with the pain, including:   Taking prenatal classes to learn about labor and delivery. The more informed you are, the less anxious and afraid you may be. This can help lessen the pain.  Taking pain-relieving medicine during labor and delivery.  Learning breathing and relaxation techniques.  Taking a shower or bath.  Getting massaged.  Changing positions.  Placing an ice pack on your back. Discuss your pain control options with your health care provider during your prenatal visits.  WHAT ARE THE TWO TYPES OF PAIN-RELIEVING MEDICINES? 1. Analgesics. These are medicines that decrease pain without total loss of feeling or muscle movement. 2. Anesthetics. These are medicines that block all feeling, including pain. There can be minor side effects of both types, such as nausea, trouble concentrating, becoming sleepy, and lowering the heart rate of the baby. However, health care providers are careful to give doses that will not seriously affect the baby.  WHAT ARE THE SPECIFIC TYPES OF ANALGESICS AND ANESTHETICS? Systemic Analgesic Systemic pain medicines affect your whole body rather than focusing pain relief on the area of your body experiencing pain. This type of medicine is given either through an IV tube in your vein or by a shot (injection) into your muscle. This medicine will lessen your pain but will not stop it completely. It may also make you sleepy, but it will not make you lose consciousness.  Local Anesthetic Local anesthetic isused tonumb a small area of your body. The medicine is injected into the area of nerves that carry feeling to the vagina, vulva, or the area between  the vagina and anus (perineum).  General Anesthetic This type of medicine causes you to lose consciousness so you do not feel pain. It is usually used only in emergency situations during labor. It is given through an IV tube or face mask. Paracervical Block A paracervical block is a form of local anesthesia given during labor. Numbing medicine is injected into the right and left sides of the cervix and vagina. It helps to lessen the pain caused by contractions and stretching of the cervix. It may have to be given more than once.  Pudendal Block A pudendal block is another form of local anesthesia. It is used to relieve the pain associated with pushing or stretching of the perineum at the time of delivery. An injection is given deep through the vaginal wall into the pudendal nerve in the pelvis, numbing the perineum.  Epidural Anesthetic An epidural is an injection of numbing medicine given in the lower back and into the epidural space near your spinal cord. The epidural numbs the lower half of your body. You may be able to move your legs but will not be allowed to walk. Epidurals can be used for labor, delivery, or cesarean deliveries.  To prevent the medicine from wearing off, a small tube (catheter) may be threaded into the epidural space and taped in place to prevent it from slipping out. Medicine can then be given continuously in small doses through the tube until you deliver. Spinal Block A spinal block is similar to an epidural, but the medicine is injected into the spinal fluid, not the epidural space. A spinal block is only given   once. It starts to relieve pain quickly but lasts only 1-2 hours. Spinal blocks can also be used for cesarean deliveries.  Combined Spinal-Epidural Block Combined spinal-epidural blocks combine the benefits of both the spinal and epidural blocks. The spinal part acts quickly to relieve pain and the epidural provides continuous pain relief. Hydrotherapy Immersion in  warm water during labor may provide comfort and relaxation. It may also help to lessen pain, the use of anesthesia, and the length of labor. However, immersion in water during the delivery (water birth) may have some risk involved and studies to determine safety and risks are ongoing. If you are a healthy woman who is expecting an uncomplicated birth, talk with your health care provider to see if water birth is an option for you.  Document Released: 09/25/2008 Document Revised: 06/14/2013 Document Reviewed: 10/28/2012 ExitCare Patient Information 2015 [employer]. This information is not intended to replace advice given to you by your health care provider. Make sure you discuss any questions you have with your health care provider.  

## 2014-12-06 NOTE — Progress Notes (Signed)
C/o white vaginal discharge with itching- wet prep today.

## 2014-12-07 LAB — WET PREP, GENITAL
Trich, Wet Prep: NONE SEEN
WBC WET PREP: NONE SEEN
Yeast Wet Prep HPF POC: NONE SEEN

## 2014-12-14 ENCOUNTER — Encounter (HOSPITAL_COMMUNITY): Payer: Self-pay | Admitting: *Deleted

## 2014-12-14 ENCOUNTER — Inpatient Hospital Stay (HOSPITAL_COMMUNITY)
Admission: AD | Admit: 2014-12-14 | Discharge: 2014-12-14 | Disposition: A | Discharge status: Home / Self Care | Payer: Medicaid Other | Source: Ambulatory Visit | Attending: Family Medicine | Admitting: Family Medicine

## 2014-12-14 DIAGNOSIS — M545 Low back pain, unspecified: Secondary | ICD-10-CM

## 2014-12-14 DIAGNOSIS — Z87891 Personal history of nicotine dependence: Secondary | ICD-10-CM | POA: Diagnosis not present

## 2014-12-14 DIAGNOSIS — M549 Dorsalgia, unspecified: Secondary | ICD-10-CM | POA: Insufficient documentation

## 2014-12-14 DIAGNOSIS — Z3492 Encounter for supervision of normal pregnancy, unspecified, second trimester: Secondary | ICD-10-CM

## 2014-12-14 DIAGNOSIS — Z3A32 32 weeks gestation of pregnancy: Secondary | ICD-10-CM | POA: Diagnosis not present

## 2014-12-14 DIAGNOSIS — O9989 Other specified diseases and conditions complicating pregnancy, childbirth and the puerperium: Secondary | ICD-10-CM | POA: Diagnosis not present

## 2014-12-14 DIAGNOSIS — F1290 Cannabis use, unspecified, uncomplicated: Secondary | ICD-10-CM

## 2014-12-14 HISTORY — DX: Urinary tract infection, site not specified: N39.0

## 2014-12-14 HISTORY — DX: Renal tubulo-interstitial disease, unspecified: N15.9

## 2014-12-14 LAB — URINALYSIS, ROUTINE W REFLEX MICROSCOPIC
Glucose, UA: NEGATIVE mg/dL
KETONES UR: 40 mg/dL — AB
NITRITE: NEGATIVE
PH: 7 (ref 5.0–8.0)
PROTEIN: 30 mg/dL — AB
Specific Gravity, Urine: 1.02 (ref 1.005–1.030)
UROBILINOGEN UA: 1 mg/dL (ref 0.0–1.0)

## 2014-12-14 LAB — URINE MICROSCOPIC-ADD ON

## 2014-12-14 MED ORDER — ALBUTEROL SULFATE HFA 108 (90 BASE) MCG/ACT IN AERS: 2.0000 | INHALATION_SPRAY | Freq: Four times a day (QID) | RESPIRATORY_TRACT | Status: DC | PRN

## 2014-12-14 MED ORDER — LIDO-CAPSAICIN-MEN-METHYL SAL 0.5-0.035-5-20 % EX PTCH: MEDICATED_PATCH | CUTANEOUS | Status: DC

## 2014-12-14 MED ORDER — ESOMEPRAZOLE MAGNESIUM 10 MG PO PACK: 10.0000 mg | PACK | Freq: Every day | ORAL | Status: DC

## 2014-12-14 NOTE — MAU Note (Addendum)
Pt c/o Mid Lower right back pain x 3 days. C/o headache and poor appetite and weakness as well.had a temp 100.7 at home today.

## 2014-12-14 NOTE — Discharge Instructions (Signed)

## 2014-12-14 NOTE — MAU Provider Note (Signed)
History   CSN: [id number]  Arrival date and time: 12/14/14 1655  Chief Complaint  Patient presents with  . Back Pain    HPI  Patient is 19 y.o. [id number] [redacted]w[redacted]d here with complaints of right-sided back and side pain for the last 3 days.  She states that it is a cramping type of pain. She has associated feverish feeling with the highest temperature at home being 100.1. Pain does not subside. Severity 8/10. Patient just recently treated for pyleo and UTI in pregnancy. Has finished he Keflex.   +FM, denies LOF, VB, contractions, vaginal discharge.   OB History    Gravida Para Term Preterm AB TAB SAB Ectopic Multiple Living   0 2 0 0 1      Past Medical History  Diagnosis Date  . Bronchitis   . UTI (urinary tract infection)   . Kidney infection     Past Surgical History  Procedure Laterality Date  . Dilation and curettage of uterus    . Cesarean section      History reviewed. No pertinent family history.  History  Substance Use Topics  . Smoking status: Former Games developer  . Smokeless tobacco: Never Used  . Alcohol Use: No    Allergies: No Known Allergies  Prescriptions prior to admission  Medication Sig Dispense Refill Last Dose  . albuterol (PROVENTIL HFA;VENTOLIN HFA) 108 (90 BASE) MCG/ACT inhaler Inhale 2 puffs into the lungs every 6 (six) hours as needed for shortness of breath.   Past Month at Unknown time  . cyclobenzaprine (FLEXERIL) 10 MG tablet Take 1 tablet (10 mg total) by mouth 3 (three) times daily. (Patient taking differently: Take 10 mg by mouth 3 (three) times daily as needed for muscle spasms. ) 30 tablet 0 12/13/2014 at Unknown time  . Prenatal Vit-Fe Fumarate-FA (PRENATAL VITAMINS PLUS) 27-1 MG TABS Take 1 tablet by mouth daily. 30 tablet 6 12/13/2014 at Unknown time  . cephALEXin (KEFLEX) 500 MG capsule Take 1 capsule (500 mg total) by mouth 4 (four) times daily. (Patient not taking: Reported on 12/06/2014) 28 capsule 0 Completed Course at Unknown  time  . terconazole (TERAZOL 7) 0.4 % vaginal cream Place 1 applicator vaginally at bedtime. Apply to external surface as well. (Patient not taking: Reported on 12/14/2014) 45 g 0 Not Taking at Unknown time    Review of Systems  Constitutional:       Feels ferverish  Eyes: Negative for blurred vision.  Respiratory: Negative for shortness of breath.   Cardiovascular: Negative for chest pain and leg swelling.  Gastrointestinal: Positive for nausea. Negative for vomiting.  Genitourinary: Positive for flank pain. Negative for dysuria and hematuria.  Musculoskeletal: Positive for back pain.  Neurological: Negative for headaches.  Also per HPI  Physical Exam   Blood pressure 110/54, pulse 134, temperature 99.8 F (37.7 C), temperature source Oral, resp. rate 18, last menstrual period 04/27/2014, not currently breastfeeding.  Physical Exam  Constitutional: She is oriented to person, place, and time. She appears well-developed and well-nourished. No distress.  HENT:  Head: Normocephalic and atraumatic.  Eyes: EOM are normal.  Cardiovascular: Normal rate, regular rhythm, normal heart sounds and intact distal pulses.   Respiratory: Effort normal and breath sounds normal.  GI: Soft. Bowel sounds are normal. There is no tenderness.  gravid  Genitourinary:  No suprapubic tenderness  Musculoskeletal: Normal range of motion. She exhibits tenderness. She exhibits no edema.  No CVA tenderness  Neurological: She is alert  and oriented to person, place, and time. She has normal strength. No sensory deficit.  Skin: Skin is warm and dry.   Results for orders placed or performed during the hospital encounter of 12/14/14 (from the past 24 hour(s))  Urinalysis, Routine w reflex microscopic (not at [facility])     Status: Abnormal   Collection Time: 12/14/14  5:15 PM  Result Value Ref Range   Color, Urine YELLOW YELLOW   APPearance CLEAR CLEAR   Specific Gravity, Urine 1.020 1.005 - 1.030   pH 7.0 5.0 -  8.0   Glucose, UA NEGATIVE NEGATIVE mg/dL   Hgb urine dipstick SMALL (A) NEGATIVE   Bilirubin Urine SMALL (A) NEGATIVE   Ketones, ur 40 (A) NEGATIVE mg/dL   Protein, ur 30 (A) NEGATIVE mg/dL   Urobilinogen, UA 1.0 0.0 - 1.0 mg/dL   Nitrite NEGATIVE NEGATIVE   Leukocytes, UA SMALL (A) NEGATIVE  Urine microscopic-add on     Status: Abnormal   Collection Time: 12/14/14  5:15 PM  Result Value Ref Range   Squamous Epithelial / LPF MANY (A) RARE   WBC, UA 7-10 <3 WBC/hpf   RBC / HPF 3-6 <3 RBC/hpf   Bacteria, UA MANY (A) RARE    MAU Course  Procedures - None  MDM: -UA appears contaminated however cultures sent -FHT reassuring  Assessment and Plan  A: Patient is 19 y.o. [id number] [redacted]w[redacted]d reporting pain in her back likely secondary to MSK complaints from pregnancy but cannot fully r/o Pyleonephritis. No CVA tenderness on exam. No concern for PTL. Physical exam was unremarkable and patient afebrile. Urine cultures pending.   P: Discharge home - Reviewed findings and my conclusion - Recommend patient continue conservative treatments - Will contact patient if urine cultures positive - Rx for lidocaine patch - fetal kick counts reinforced - preterm labor precautions dicussed - Handout given - Follow-up with OB provider   Caryl Ada, DO 12/14/2014, 7:12 PM PGY-1, [facility]  I have seen and examined this patient and agree the above assessment. CRESENZO-DISHMAN,Seabron Iannello 12/14/2014 10:18 PM

## 2014-12-18 LAB — CULTURE, OB URINE: Culture: >=100000

## 2014-12-20 ENCOUNTER — Encounter: Payer: Medicaid Other | Admitting: Family Medicine

## 2014-12-27 ENCOUNTER — Inpatient Hospital Stay (HOSPITAL_COMMUNITY)
Admission: AD | Admit: 2014-12-27 | Discharge: 2014-12-27 | Disposition: A | Discharge status: Home / Self Care | Payer: Medicaid Other | Source: Ambulatory Visit | Attending: Obstetrics & Gynecology | Admitting: Obstetrics & Gynecology

## 2014-12-27 ENCOUNTER — Encounter (HOSPITAL_COMMUNITY): Payer: Self-pay | Admitting: *Deleted

## 2014-12-27 DIAGNOSIS — K5900 Constipation, unspecified: Secondary | ICD-10-CM | POA: Diagnosis not present

## 2014-12-27 DIAGNOSIS — O2343 Unspecified infection of urinary tract in pregnancy, third trimester: Secondary | ICD-10-CM | POA: Diagnosis not present

## 2014-12-27 DIAGNOSIS — Z3A34 34 weeks gestation of pregnancy: Secondary | ICD-10-CM | POA: Insufficient documentation

## 2014-12-27 DIAGNOSIS — Z87891 Personal history of nicotine dependence: Secondary | ICD-10-CM | POA: Insufficient documentation

## 2014-12-27 DIAGNOSIS — M549 Dorsalgia, unspecified: Secondary | ICD-10-CM | POA: Diagnosis present

## 2014-12-27 DIAGNOSIS — N3000 Acute cystitis without hematuria: Secondary | ICD-10-CM | POA: Diagnosis not present

## 2014-12-27 DIAGNOSIS — Z3492 Encounter for supervision of normal pregnancy, unspecified, second trimester: Secondary | ICD-10-CM

## 2014-12-27 DIAGNOSIS — F1290 Cannabis use, unspecified, uncomplicated: Secondary | ICD-10-CM

## 2014-12-27 DIAGNOSIS — R12 Heartburn: Secondary | ICD-10-CM | POA: Insufficient documentation

## 2014-12-27 LAB — URINE MICROSCOPIC-ADD ON

## 2014-12-27 LAB — URINALYSIS, ROUTINE W REFLEX MICROSCOPIC
Bilirubin Urine: NEGATIVE
Glucose, UA: NEGATIVE mg/dL
Ketones, ur: 40 mg/dL — AB
Nitrite: NEGATIVE
PH: 6 (ref 5.0–8.0)
Protein, ur: 100 mg/dL — AB
SPECIFIC GRAVITY, URINE: 1.02 (ref 1.005–1.030)
Urobilinogen, UA: 0.2 mg/dL (ref 0.0–1.0)

## 2014-12-27 MED ORDER — CEFPODOXIME PROXETIL 100 MG PO TABS: 100.0000 mg | ORAL_TABLET | Freq: Two times a day (BID) | ORAL | Status: DC

## 2014-12-27 MED ORDER — LACTATED RINGERS IV BOLUS (SEPSIS)
1000.0000 mL | Freq: Once | INTRAVENOUS | Status: AC
  Administered 2014-12-27: 1000 mL via INTRAVENOUS

## 2014-12-27 NOTE — Discharge Instructions (Signed)
Pregnancy and Urinary Tract Infection °A urinary tract infection (UTI) is a bacterial infection of the urinary tract. Infection of the urinary tract can include the ureters, kidneys (pyelonephritis), bladder (cystitis), and urethra (urethritis). All pregnant women should be screened for bacteria in the urinary tract. Identifying and treating a UTI will decrease the risk of preterm labor and developing more serious infections in both the mother and baby. °CAUSES °Bacteria germs cause almost all UTIs.  °RISK FACTORS °Many factors can increase your chances of getting a UTI during pregnancy. These include: °· Having a short urethra. °· Poor toilet and hygiene habits. °· Sexual intercourse. °· Blockage of urine along the urinary tract. °· Problems with the pelvic muscles or nerves. °· Diabetes. °· Obesity. °· Bladder problems after having several children. °· Previous history of UTI. °SIGNS AND SYMPTOMS  °· Pain, burning, or a stinging feeling when urinating. °· Suddenly feeling the need to urinate right away (urgency). °· Loss of bladder control (urinary incontinence). °· Frequent urination, more than is common with pregnancy. °· Lower abdominal or back discomfort. °· Cloudy urine. °· Blood in the urine (hematuria). °· Fever.  °When the kidneys are infected, the symptoms may be: °· Back pain. °· Flank pain on the right side more so than the left. °· Fever. °· Chills. °· Nausea. °· Vomiting. °DIAGNOSIS  °A urinary tract infection is usually diagnosed through urine tests. Additional tests and procedures are sometimes done. These may include: °· Ultrasound exam of the kidneys, ureters, bladder, and urethra. °· Looking in the bladder with a lighted tube (cystoscopy). °TREATMENT °Typically, UTIs can be treated with antibiotic medicines.  °HOME CARE INSTRUCTIONS  °· Only take over-the-counter or prescription medicines as directed by your health care provider. If you were prescribed antibiotics, take them as directed. Finish  them even if you start to feel better. °· Drink enough fluids to keep your urine clear or pale yellow. °· Do not have sexual intercourse until the infection is gone and your health care provider says it is okay. °· Make sure you are tested for UTIs throughout your pregnancy. These infections often come back.  °Preventing a UTI in the Future °· Practice good toilet habits. Always wipe from front to back. Use the tissue only once. °· Do not hold your urine. Empty your bladder as soon as possible when the urge comes. °· Do not douche or use deodorant sprays. °· Wash with soap and warm water around the genital area and the anus. °· Empty your bladder before and after sexual intercourse. °· Wear underwear with a cotton crotch. °· Avoid caffeine and carbonated drinks. They can irritate the bladder. °· Drink cranberry juice or take cranberry pills. This may decrease the risk of getting a UTI. °· Do not drink alcohol. °· Keep all your appointments and tests as scheduled.  °SEEK MEDICAL CARE IF:  °· Your symptoms get worse. °· You are still having fevers 2 or more days after treatment begins. °· You have a rash. °· You feel that you are having problems with medicines prescribed. °· You have abnormal vaginal discharge. °SEEK IMMEDIATE MEDICAL CARE IF:  °· You have back or flank pain. °· You have chills. °· You have blood in your urine. °· You have nausea and vomiting. °· You have contractions of your uterus. °· You have a gush of fluid from the vagina. °MAKE SURE YOU: °· Understand these instructions.   °· Will watch your condition.   °· Will get help right away if you are not doing   well or get worse.   °Document Released: 10/04/2010 Document Revised: 03/30/2013 Document Reviewed: 01/06/2013 °ExitCare® Patient Information ©2015 [employer]. This information is not intended to replace advice given to you by your health care provider. Make sure you discuss any questions you have with your health care provider. ° °

## 2014-12-27 NOTE — MAU Note (Signed)
Past wk and a half has had bad back pain.  Yesterday was wiped out, some pain in RLQ.  Pee is super dark.  Whole lower back hurts and having cramping, and rt flank pain.  Having a lot of pressure.

## 2014-12-27 NOTE — MAU Provider Note (Signed)
History     CSN: [id number]  Arrival date and time: 12/27/14 1240   None     Chief Complaint  Patient presents with  . Back Pain   HPI  Patient is 19 y.o. [id number] here with complaints of urinary discomfort and right back pain x1 week, increasing in pain the past couple days. Has tried heat with no relief. Has not tried any OTC pain meds. Hx of 1 UTI this pregnancy. States that she drinks plenty of water.   Notes that she has missed the past 2 prenatal visits.   +FM, denies LOF, VB, contractions, vaginal discharge.     Past Medical History  Diagnosis Date  . Bronchitis   . UTI (urinary tract infection)   . Kidney infection     Past Surgical History  Procedure Laterality Date  . Dilation and curettage of uterus    . Cesarean section      History reviewed. No pertinent family history.  History  Substance Use Topics  . Smoking status: Former Games developermoker  . Smokeless tobacco: Never Used  . Alcohol Use: No    Allergies: No Known Allergies  Prescriptions prior to admission  Medication Sig Dispense Refill Last Dose  . albuterol (PROVENTIL HFA;VENTOLIN HFA) 108 (90 BASE) MCG/ACT inhaler Inhale 2 puffs into the lungs every 6 (six) hours as needed for shortness of breath. 1 Inhaler 1 Past Week at Unknown time  . Prenatal Vit-Fe Fumarate-FA (PRENATAL VITAMINS PLUS) 27-1 MG TABS Take 1 tablet by mouth daily. 30 tablet 6 Past Week at Unknown time  . esomeprazole (NEXIUM) 10 MG packet Take 10 mg by mouth daily before breakfast. 30 each 2   . Lido-Capsaicin-Men-Methyl Sal (MEDI-PATCH-LIDOCAINE) 0.5-0.035-5-20 % PTCH Apply patch daily as needed for pain. 30 patch 0     Review of Systems  Constitutional: Positive for chills. Negative for fever and malaise/fatigue.  HENT: Negative for congestion.   Eyes: Negative for blurred vision and double vision.  Respiratory: Negative for cough and shortness of breath.   Cardiovascular: Negative for chest pain, palpitations,  claudication and leg swelling.  Gastrointestinal: Positive for nausea and constipation. Negative for heartburn, vomiting, abdominal pain and diarrhea.  Genitourinary: Positive for frequency and hematuria (questionable, states her urine was dark in color). Negative for dysuria (pressure when peeing).  Musculoskeletal: Negative for myalgias and back pain.  Skin: Negative for itching and rash.  Neurological: Negative for dizziness, loss of consciousness and headaches.   Physical Exam   Blood pressure 131/77, pulse 115, temperature 98 F (36.7 C), temperature source Oral, resp. rate 20, height 5\' 5"  (1.651 m), weight 152 lb (68.947 kg), last menstrual period 04/27/2014, not currently breastfeeding.  Physical Exam  Constitutional: She is oriented to person, place, and time. She appears well-developed and well-nourished. No distress.  HENT:  Head: Normocephalic and atraumatic.  Eyes: Conjunctivae and EOM are normal.  Neck: Normal range of motion. No thyromegaly present.  Cardiovascular: Normal rate, regular rhythm and normal heart sounds.  Exam reveals no gallop and no friction rub.   No murmur heard. Respiratory: Breath sounds normal. No respiratory distress. She has no wheezes. She has no rales.  GI: Soft. Bowel sounds are normal. She exhibits no distension. There is no tenderness.  Musculoskeletal: Normal range of motion. She exhibits no edema. Tenderness: right flank.  Neurological: She is alert and oriented to person, place, and time.  Skin: Skin is warm and dry. No rash noted. No erythema.  Psychiatric: She has a normal  mood and affect. Her behavior is normal.  Cervix: long and closed   MAU Course  Procedures  MDM Chart reviewed. UA performed and urine cx ordered. Pt requested cervical exam. Fetal monitoring performed. There were 2-3 variable decels seen on monitor. Otherwise, good variability. Pt was bolused with IV fluids. Afterwards, strip looked good with no decels.   Assessment  and Plan  Patient is 19 y.o. [id number] [redacted]w[redacted]d reporting back pain and urinary discomfort likely secondary to UTI. - fetal kick counts reinforced - preterm labor precautions -abx given for UTI and urine cx sent  -flexeril called in to pharmacy for back pain -colace and zantac called into pharmacy for constipation and heartburn   De Hollingshead 12/27/2014, 3:12 PM    OB fellow attestation:  I have seen and examined this patient; I agree with above documentation in the resident's note.   CHAU SAVELL is a 19 y.o. [phone] reporting dysuria, back pain +FM, denies LOF, VB, contractions, vaginal discharge.  PE: BP 115/47 mmHg  Pulse 118  Temp(Src) 98 F (36.7 C) (Oral)  Resp 18  Ht  (1.651 m)  Wt 152 lb (68.947 kg)  BMI 25.29 kg/m2  LMP 04/27/2014 Gen: calm comfortable, NAD Resp: normal effort, no distress Abd: gravid   ROS, labs, PMH reviewed NST reactive  Plan: - fetal kick counts reinforced, preterm labor precautions - continue routine follow up in OB clinic - UTI: urine cx, vantin rx - flexeril prn back pain - colace and zantac for constipation/heart burn  Perry Mount, MD 1:02 PM

## 2014-12-29 LAB — CULTURE, OB URINE: Special Requests: NORMAL

## 2014-12-30 ENCOUNTER — Inpatient Hospital Stay (HOSPITAL_COMMUNITY)
Admission: AD | Admit: 2014-12-30 | Discharge: 2014-12-30 | Disposition: A | Discharge status: Home / Self Care | Payer: Medicaid Other | Source: Ambulatory Visit | Attending: Obstetrics and Gynecology | Admitting: Obstetrics and Gynecology

## 2014-12-30 ENCOUNTER — Encounter (HOSPITAL_COMMUNITY): Payer: Self-pay

## 2014-12-30 DIAGNOSIS — Z87891 Personal history of nicotine dependence: Secondary | ICD-10-CM | POA: Diagnosis not present

## 2014-12-30 DIAGNOSIS — O9989 Other specified diseases and conditions complicating pregnancy, childbirth and the puerperium: Secondary | ICD-10-CM | POA: Diagnosis not present

## 2014-12-30 DIAGNOSIS — Z3A35 35 weeks gestation of pregnancy: Secondary | ICD-10-CM | POA: Diagnosis not present

## 2014-12-30 DIAGNOSIS — N898 Other specified noninflammatory disorders of vagina: Secondary | ICD-10-CM

## 2014-12-30 LAB — URINALYSIS, ROUTINE W REFLEX MICROSCOPIC
BILIRUBIN URINE: NEGATIVE
GLUCOSE, UA: NEGATIVE mg/dL
Hgb urine dipstick: NEGATIVE
Ketones, ur: NEGATIVE mg/dL
Nitrite: NEGATIVE
PH: 6 (ref 5.0–8.0)
Protein, ur: NEGATIVE mg/dL
Urobilinogen, UA: 0.2 mg/dL (ref 0.0–1.0)

## 2014-12-30 LAB — AMNISURE RUPTURE OF MEMBRANE (ROM) NOT AT ARMC: Amnisure ROM: NEGATIVE

## 2014-12-30 LAB — URINE MICROSCOPIC-ADD ON

## 2014-12-30 NOTE — Progress Notes (Signed)
Called to notify of negative amnisure. Dr will call back

## 2014-12-30 NOTE — MAU Note (Signed)
Pt presents stating that she lost her mucous plug and had a pop sensation below her csection scar yesterday. States she then had some leaking. Has not had leaking since then. Increased pressure in her pelvis today. Reports good fetal movement. Denies vaginal bleeding. Denies pain.

## 2014-12-30 NOTE — MAU Provider Note (Signed)
History     CSN: [id number]  Arrival date and time: 12/30/14 6213   First Provider Initiated Contact with Patient 12/30/14 2103      Chief Complaint  Patient presents with  . Vaginal Discharge   HPI Ms. CARLEEN RHUE is a 19 y.o. [phone] at [redacted]w[redacted]d who presents to MAU today with complaint of LOF. She states that yesterday she felt a small pop and then noted a small amount of clear fluid leaking. She denies continued leakage. She also states that she has been having some mucus discharge x 2 days. She states few infrequent contractions. She denies vaginal bleeding. She reports good fetal movement. She had a previous C/S for failure to progress and is unsure of whether she will TOLAC or repeat C/S.   OB History    Gravida Para Term Preterm AB TAB SAB Ectopic Multiple Living   0 2 0 0 1      Past Medical History  Diagnosis Date  . Bronchitis   . UTI (urinary tract infection)   . Kidney infection     Past Surgical History  Procedure Laterality Date  . Dilation and curettage of uterus    . Cesarean section      History reviewed. No pertinent family history.  History  Substance Use Topics  . Smoking status: Former Games developer  . Smokeless tobacco: Never Used  . Alcohol Use: No    Allergies: No Known Allergies  Prescriptions prior to admission  Medication Sig Dispense Refill Last Dose  . albuterol (PROVENTIL HFA;VENTOLIN HFA) 108 (90 BASE) MCG/ACT inhaler Inhale 2 puffs into the lungs every 6 (six) hours as needed for shortness of breath. 1 Inhaler 1 12/29/2014 at rescue  . cefpodoxime (VANTIN) 200 MG tablet Take 0.5 tablets by mouth 2 (two) times daily.  0 12/30/2014 at Unknown time  . cyclobenzaprine (FLEXERIL) 5 MG tablet Take 1 tablet by mouth 3 (three) times daily as needed for muscle spasms.   0 12/30/2014 at Unknown time  . esomeprazole (NEXIUM) 10 MG packet Take 10 mg by mouth daily before breakfast. 30 each 2 12/29/2014 at Unknown time  . Lido-Capsaicin-Men-Methyl  Sal (MEDI-PATCH-LIDOCAINE) 0.5-0.035-5-20 % PTCH Apply patch daily as needed for pain. 30 patch 0 Past Month at Unknown time  . Prenatal Vit-Fe Fumarate-FA (PRENATAL VITAMINS PLUS) 27-1 MG TABS Take 1 tablet by mouth daily. 30 tablet 6 two weeks  . cefpodoxime (VANTIN) 100 MG tablet Take 1 tablet (100 mg total) by mouth 2 (two) times daily. (Patient not taking: Reported on 12/30/2014) 14 tablet 0 Not Taking at Unknown time    Review of Systems  Constitutional: Negative for malaise/fatigue.  Gastrointestinal: Negative for abdominal pain.  Genitourinary:       + vaginal discharge, LOF Neg - vaginal bleeding   Physical Exam   Blood pressure 128/65, pulse 98, temperature 98.2 F (36.8 C), temperature source Oral, resp. rate 18, height  (1.651 m), weight 152 lb (68.947 kg), last menstrual period 04/27/2014, SpO2 100 %.  Physical Exam  Nursing note and vitals reviewed. Constitutional: She is oriented to person, place, and time. She appears well-developed and well-nourished. No distress.  HENT:  Head: Normocephalic and atraumatic.  Cardiovascular: Normal rate.   Respiratory: Effort normal.  GI: Soft. She exhibits no distension and no mass. There is no tenderness. There is no rebound and no guarding.  Neurological: She is alert and oriented to person, place, and time.  Skin: Skin is warm  and dry. No erythema.  Psychiatric: She has a normal mood and affect.   Results for orders placed or performed during the hospital encounter of 12/30/14 (from the past 24 hour(s))  Urinalysis, Routine w reflex microscopic ([facility])     Status: Abnormal   Collection Time: 12/30/14  7:28 PM  Result Value Ref Range   Color, Urine YELLOW YELLOW   APPearance CLEAR CLEAR   Specific Gravity, Urine <1.005 (L) 1.005 - 1.030   pH 6.0 5.0 - 8.0   Glucose, UA NEGATIVE NEGATIVE mg/dL   Hgb urine dipstick NEGATIVE NEGATIVE   Bilirubin Urine NEGATIVE NEGATIVE   Ketones, ur NEGATIVE NEGATIVE mg/dL   Protein,  ur NEGATIVE NEGATIVE mg/dL   Urobilinogen, UA 0.2 0.0 - 1.0 mg/dL   Nitrite NEGATIVE NEGATIVE   Leukocytes, UA MODERATE (A) NEGATIVE  Urine microscopic-add on     Status: Abnormal   Collection Time: 12/30/14  7:28 PM  Result Value Ref Range   Squamous Epithelial / LPF FEW (A) RARE   WBC, UA 7-10 <3 WBC/hpf   Bacteria, UA FEW (A) RARE  Amnisure rupture of membrane (rom)not [facility]     Status: None   Collection Time: 12/30/14  8:20 PM  Result Value Ref Range   Amnisure ROM NEGATIVE    Fetal Monitoring: Baseline: 110 bpm, moderate variability, + accelerations, no decelerations Contractions: few irregular with mild UI  MAU Course  Procedures None  MDM UA and Amnisure today  Assessment and Plan  A: SIUP at 7515w0d Membranes intact  P: Discharge home Preterm labor precautions and kick counts discussed Patient advised to follow-up with [facility] as scheduled for routine prenatal care this week Patient may return to MAU as needed or if her condition were to change or worsen   Marny LowensteinJulie N Hassani Sliney, PA-C  12/30/2014, 9:03 PM

## 2014-12-30 NOTE — Progress Notes (Signed)
Notified of pt arrival in MAU and complaint of possible ROM. CNM will notify oncoming provider and have her come see pt

## 2014-12-30 NOTE — Discharge Instructions (Signed)
Trial of Labor After Cesarean Delivery Information  A trial of labor after cesarean delivery (TOLAC) is when a woman tries to give birth vaginally after a previous cesarean delivery. TOLAC may be a safe and appropriate option for you depending on your medical history and other risk factors. When TOLAC is successful and you are able to have a vaginal delivery, this is called a vaginal birth after cesarean delivery (VBAC).   CANDIDATES FOR TOLAC  TOLAC is possible for some women who:   Have undergone one or two prior cesarean deliveries in which the incision of the uterus was horizontal (low transverse).   Are carrying twins and have had one prior low transverse incision during a cesarean delivery.   Do not have a vertical (classical) uterine scar.   Have not had a tear in the wall of their uterus (uterine rupture).  TOLAC is also supported for women who meet appropriate criteria and:   Are under the age of 40 years.   Are tall and have a body mass index (BMI) of less than 30.   Have an unknown uterine scar.   Give birth in a facility equipped to handle an emergency cesarean delivery. This team should be able to handle possible complications such as a uterine rupture.   Have thorough counseling about the benefits and risks of TOLAC.   Have discussed future pregnancy plans with their health care provider.   Plan to have several more pregnancies.  MOST SUCCESSFUL CANDIDATES FOR TOLAC:   Have had a successful vaginal delivery before or after their cesarean delivery.   Experience labor that begins naturally on or before the due date (40 weeks of gestation).   Do not have a very large (macrosomic) baby.    Had a prior cesarean delivery but are not currently experiencing factors that would prompt a cesarean delivery (such as a breech position).   Had only one prior cesarean delivery.   Had a prior cesarean delivery that was performed early in labor and not after full cervical dilation.  TOLAC may be most  appropriate for women who meet the above guidelines and who plan to have more pregnancies. TOLAC is not recommended for home births.  LEAST SUCCESSFUL CANDIDATES FOR TOLAC:   Have an induced labor with an unfavorable cervix. An unfavorable cervix is when the cervix is not dilating enough (among other factors).   Have never had a vaginal delivery.   Have had more than two cesarean deliveries.   Have a pregnancy at more than 40 weeks of gestation.   Are pregnant with a baby with a suspected weight greater than 4,000 grams (8 pounds) and who have no prior history of a vaginal delivery.   Have closely spaced pregnancies.  SUGGESTED BENEFITS OF TOLAC   You may have a faster recovery time.   You may have a shorter stay in the hospital.   You may have less pain and fewer problems than with a cesarean delivery. Women who have a cesarean delivery have a higher chance of needing blood or getting a fever, an infection, or a blood clot in the legs.  SUGGESTED RISKS OF TOLAC  The highest risk of complications happens to women who attempt a TOLAC and fail. A failed TOLAC results in an unplanned cesarean delivery. Risks related to TOLAC or repeat cesarean deliveries include:    Blood loss.   Infection.   Blood clot.   Injury to surrounding tissues or organs.   Having to remove the   emergency cesarean delivery.  Having a bad outcome for the baby (perinatal morbidity). FOR MORE INFORMATION [employer] of Obstetricians and Gynecologists: www.acog.org [employer]: www.midwife.org Document Released: 02/25/2011 Document Revised: 03/30/2013 Document Reviewed: 11/29/2012 [facility] Patient Information 2015 [place], [place]. This  information is not intended to replace advice given to you by your health care provider. Make sure you discuss any questions you have with your health care provider. Braxton Hicks Contractions Contractions of the uterus can occur throughout pregnancy. Contractions are not always a sign that you are in labor.  WHAT ARE BRAXTON HICKS CONTRACTIONS?  Contractions that occur before labor are called Braxton Hicks contractions, or false labor. Toward the end of pregnancy (32-34 weeks), these contractions can develop more often and may become more forceful. This is not true labor because these contractions do not result in opening (dilatation) and thinning of the cervix. They are sometimes difficult to tell apart from true labor because these contractions can be forceful and people have different pain tolerances. You should not feel embarrassed if you go to the hospital with false labor. Sometimes, the only way to tell if you are in true labor is for your health care provider to look for changes in the cervix. If there are no prenatal problems or other health problems associated with the pregnancy, it is completely safe to be sent home with false labor and await the onset of true labor. HOW CAN YOU TELL THE DIFFERENCE BETWEEN TRUE AND FALSE LABOR? False Labor  The contractions of false labor are usually shorter and not as hard as those of true labor.   The contractions are usually irregular.   The contractions are often felt in the front of the lower abdomen and in the groin.   The contractions may go away when you walk around or change positions while lying down.   The contractions get weaker and are shorter lasting as time goes on.   The contractions do not usually become progressively stronger, regular, and closer together as with true labor.  True Labor  Contractions in true labor last 30-70 seconds, become very regular, usually become more intense, and increase in frequency.   The  contractions do not go away with walking.   The discomfort is usually felt in the top of the uterus and spreads to the lower abdomen and low back.   True labor can be determined by your health care provider with an exam. This will show that the cervix is dilating and getting thinner.  WHAT TO REMEMBER  Keep up with your usual exercises and follow other instructions given by your health care provider.   Take medicines as directed by your health care provider.   Keep your regular prenatal appointments.   Eat and drink lightly if you think you are going into labor.   If Braxton Hicks contractions are making you uncomfortable:   Change your position from lying down or resting to walking, or from walking to resting.   Sit and rest in a tub of warm water.   Drink 2-3 glasses of water. Dehydration may cause these contractions.   Do slow and deep breathing several times an hour.  WHEN SHOULD I SEEK IMMEDIATE MEDICAL CARE? Seek immediate medical care if:  Your contractions become stronger, more regular, and closer together.   You have fluid leaking or gushing from your vagina.   You have a fever.   You pass blood-tinged mucus.   You have vaginal bleeding.   You  have continuous abdominal pain.   You have low back pain that you never had before.   You feel your baby's head pushing down and causing pelvic pressure.   Your baby is not moving as much as it used to.  Document Released: 06/09/2005 Document Revised: 06/14/2013 Document Reviewed: 03/21/2013 [facility] Patient Information 2015 [place], [place]. This information is not intended to replace advice given to you by your health care provider. Make sure you discuss any questions you have with your health care provider.

## 2015-01-03 ENCOUNTER — Ambulatory Visit (INDEPENDENT_AMBULATORY_CARE_PROVIDER_SITE_OTHER): Payer: Medicaid Other | Admitting: Advanced Practice Midwife

## 2015-01-03 VITALS — BP 129/73 | HR 69 | Temp 98.6°F | Wt 151.5 lb

## 2015-01-03 DIAGNOSIS — O9989 Other specified diseases and conditions complicating pregnancy, childbirth and the puerperium: Secondary | ICD-10-CM

## 2015-01-03 DIAGNOSIS — O99519 Diseases of the respiratory system complicating pregnancy, unspecified trimester: Secondary | ICD-10-CM

## 2015-01-03 DIAGNOSIS — J45909 Unspecified asthma, uncomplicated: Secondary | ICD-10-CM | POA: Diagnosis not present

## 2015-01-03 DIAGNOSIS — O2342 Unspecified infection of urinary tract in pregnancy, second trimester: Secondary | ICD-10-CM

## 2015-01-03 DIAGNOSIS — O2343 Unspecified infection of urinary tract in pregnancy, third trimester: Secondary | ICD-10-CM | POA: Diagnosis not present

## 2015-01-03 DIAGNOSIS — Z3493 Encounter for supervision of normal pregnancy, unspecified, third trimester: Secondary | ICD-10-CM

## 2015-01-03 LAB — POCT URINALYSIS DIP (DEVICE)
Bilirubin Urine: NEGATIVE
Glucose, UA: NEGATIVE mg/dL
Hgb urine dipstick: NEGATIVE
KETONES UR: NEGATIVE mg/dL
LEUKOCYTES UA: NEGATIVE
NITRITE: NEGATIVE
PH: 7.5 (ref 5.0–8.0)
Protein, ur: NEGATIVE mg/dL
Specific Gravity, Urine: 1.015 (ref 1.005–1.030)
UROBILINOGEN UA: 0.2 mg/dL (ref 0.0–1.0)

## 2015-01-03 MED ORDER — PRENATAL VITAMINS PLUS 27-1 MG PO TABS: 1.0000 | ORAL_TABLET | Freq: Every day | ORAL | Status: DC

## 2015-01-03 MED ORDER — ALBUTEROL SULFATE HFA 108 (90 BASE) MCG/ACT IN AERS: 2.0000 | INHALATION_SPRAY | Freq: Four times a day (QID) | RESPIRATORY_TRACT | Status: AC | PRN

## 2015-01-03 NOTE — Progress Notes (Signed)
UTI and VVC Sx resolved.

## 2015-01-03 NOTE — Patient Instructions (Signed)
Braxton Hicks Contractions °Contractions of the uterus can occur throughout pregnancy. Contractions are not always a sign that you are in labor.  °WHAT ARE BRAXTON HICKS CONTRACTIONS?  °Contractions that occur before labor are called Braxton Hicks contractions, or false labor. Toward the end of pregnancy (32-34 weeks), these contractions can develop more often and may become more forceful. This is not true labor because these contractions do not result in opening (dilatation) and thinning of the cervix. They are sometimes difficult to tell apart from true labor because these contractions can be forceful and people have different pain tolerances. You should not feel embarrassed if you go to the hospital with false labor. Sometimes, the only way to tell if you are in true labor is for your health care provider to look for changes in the cervix. °If there are no prenatal problems or other health problems associated with the pregnancy, it is completely safe to be sent home with false labor and await the onset of true labor. °HOW CAN YOU TELL THE DIFFERENCE BETWEEN TRUE AND FALSE LABOR? °False Labor °· The contractions of false labor are usually shorter and not as hard as those of true labor.   °· The contractions are usually irregular.   °· The contractions are often felt in the front of the lower abdomen and in the groin.   °· The contractions may go away when you walk around or change positions while lying down.   °· The contractions get weaker and are shorter lasting as time goes on.   °· The contractions do not usually become progressively stronger, regular, and closer together as with true labor.   °True Labor °· Contractions in true labor last 30-70 seconds, become very regular, usually become more intense, and increase in frequency.   °· The contractions do not go away with walking.   °· The discomfort is usually felt in the top of the uterus and spreads to the lower abdomen and low back.   °· True labor can be  determined by your health care provider with an exam. This will show that the cervix is dilating and getting thinner.   °WHAT TO REMEMBER °· Keep up with your usual exercises and follow other instructions given by your health care provider.   °· Take medicines as directed by your health care provider.   °· Keep your regular prenatal appointments.   °· Eat and drink lightly if you think you are going into labor.   °· If Braxton Hicks contractions are making you uncomfortable:   °¨ Change your position from lying down or resting to walking, or from walking to resting.   °¨ Sit and rest in a tub of warm water.   °¨ Drink 2-3 glasses of water. Dehydration may cause these contractions.   °¨ Do slow and deep breathing several times an hour.   °WHEN SHOULD I SEEK IMMEDIATE MEDICAL CARE? °Seek immediate medical care if: °· Your contractions become stronger, more regular, and closer together.   °· You have fluid leaking or gushing from your vagina.   °· You have a fever.   °· You pass blood-tinged mucus.   °· You have vaginal bleeding.   °· You have continuous abdominal pain.   °· You have low back pain that you never had before.   °· You feel your baby's head pushing down and causing pelvic pressure.   °· Your baby is not moving as much as it used to.   °Document Released: 06/09/2005 Document Revised: 06/14/2013 Document Reviewed: 03/21/2013 °[employer]® Patient Information ©2015 [employer]. This information is not intended to replace advice given to you by your health care   provider. Make sure you discuss any questions you have with your health care provider. ° °

## 2015-01-09 ENCOUNTER — Encounter: Payer: Medicaid Other | Admitting: Advanced Practice Midwife

## 2015-01-24 ENCOUNTER — Inpatient Hospital Stay (HOSPITAL_COMMUNITY)
Admission: AD | Admit: 2015-01-24 | Discharge: 2015-01-27 | DRG: 775 | Disposition: A | Discharge status: Home / Self Care | Payer: Medicaid Other | Source: Ambulatory Visit | Attending: Obstetrics & Gynecology | Admitting: Obstetrics & Gynecology

## 2015-01-24 DIAGNOSIS — Z3492 Encounter for supervision of normal pregnancy, unspecified, second trimester: Secondary | ICD-10-CM

## 2015-01-24 DIAGNOSIS — O326XX0 Maternal care for compound presentation, not applicable or unspecified: Secondary | ICD-10-CM | POA: Diagnosis present

## 2015-01-24 DIAGNOSIS — O34219 Maternal care for unspecified type scar from previous cesarean delivery: Secondary | ICD-10-CM

## 2015-01-24 DIAGNOSIS — O3421 Maternal care for scar from previous cesarean delivery: Principal | ICD-10-CM | POA: Diagnosis present

## 2015-01-24 DIAGNOSIS — Z880 Allergy status to penicillin: Secondary | ICD-10-CM

## 2015-01-24 DIAGNOSIS — O99324 Drug use complicating childbirth: Secondary | ICD-10-CM | POA: Diagnosis present

## 2015-01-24 DIAGNOSIS — Z3A38 38 weeks gestation of pregnancy: Secondary | ICD-10-CM | POA: Diagnosis present

## 2015-01-24 DIAGNOSIS — F1290 Cannabis use, unspecified, uncomplicated: Secondary | ICD-10-CM | POA: Diagnosis present

## 2015-01-24 DIAGNOSIS — Z87891 Personal history of nicotine dependence: Secondary | ICD-10-CM

## 2015-01-24 HISTORY — DX: Personal history of nicotine dependence: Z87.891

## 2015-01-24 HISTORY — DX: Supervision of pregnancy with insufficient antenatal care, unspecified trimester: O09.30

## 2015-01-24 HISTORY — DX: Personal history of other specified conditions: Z87.898

## 2015-01-24 HISTORY — DX: Cannabis use, unspecified, in remission: F12.91

## 2015-01-24 NOTE — MAU Note (Signed)
Pt reports ROM at 2320, some contractions.

## 2015-01-25 ENCOUNTER — Inpatient Hospital Stay (HOSPITAL_COMMUNITY): Payer: Medicaid Other | Admitting: Anesthesiology

## 2015-01-25 ENCOUNTER — Encounter (HOSPITAL_COMMUNITY): Payer: Self-pay

## 2015-01-25 DIAGNOSIS — Z880 Allergy status to penicillin: Secondary | ICD-10-CM | POA: Diagnosis not present

## 2015-01-25 DIAGNOSIS — Z3A38 38 weeks gestation of pregnancy: Secondary | ICD-10-CM | POA: Diagnosis present

## 2015-01-25 DIAGNOSIS — O326XX0 Maternal care for compound presentation, not applicable or unspecified: Secondary | ICD-10-CM | POA: Diagnosis present

## 2015-01-25 DIAGNOSIS — O3421 Maternal care for scar from previous cesarean delivery: Secondary | ICD-10-CM | POA: Diagnosis present

## 2015-01-25 DIAGNOSIS — Z87891 Personal history of nicotine dependence: Secondary | ICD-10-CM | POA: Diagnosis not present

## 2015-01-25 DIAGNOSIS — F1290 Cannabis use, unspecified, uncomplicated: Secondary | ICD-10-CM | POA: Diagnosis present

## 2015-01-25 DIAGNOSIS — O99324 Drug use complicating childbirth: Secondary | ICD-10-CM | POA: Diagnosis present

## 2015-01-25 LAB — TYPE AND SCREEN
ABO/RH(D): O POS
ANTIBODY SCREEN: NEGATIVE

## 2015-01-25 LAB — CBC
HCT: 30.3 % — ABNORMAL LOW (ref 36.0–46.0)
HEMOGLOBIN: 10.4 g/dL — AB (ref 12.0–15.0)
MCH: 30.1 pg (ref 26.0–34.0)
MCHC: 34.3 g/dL (ref 30.0–36.0)
MCV: 87.8 fL (ref 78.0–100.0)
Platelets: 165 10*3/uL (ref 150–400)
RBC: 3.45 MIL/uL — AB (ref 3.87–5.11)
RDW: 14.5 % (ref 11.5–15.5)
WBC: 10.6 10*3/uL — ABNORMAL HIGH (ref 4.0–10.5)

## 2015-01-25 LAB — RPR: RPR Ser Ql: NONREACTIVE

## 2015-01-25 LAB — ABO/RH: ABO/RH(D): O POS

## 2015-01-25 LAB — OB RESULTS CONSOLE GBS: GBS: NEGATIVE

## 2015-01-25 LAB — GROUP B STREP BY PCR: Group B strep by PCR: NEGATIVE

## 2015-01-25 MED ORDER — OXYTOCIN 40 UNITS IN LACTATED RINGERS INFUSION - SIMPLE MED
62.5000 mL/h | INTRAVENOUS | Status: DC
Start: 2015-01-25 — End: 2015-01-25
  Administered 2015-01-25: 62.5 mL/h via INTRAVENOUS

## 2015-01-25 MED ORDER — PROMETHAZINE HCL 25 MG/ML IJ SOLN
12.5000 mg | Freq: Four times a day (QID) | INTRAMUSCULAR | Status: DC | PRN
  Administered 2015-01-25: 12.5 mg via INTRAVENOUS
  Filled 2015-01-25: qty 1

## 2015-01-25 MED ORDER — DIPHENHYDRAMINE HCL 50 MG/ML IJ SOLN: 12.5000 mg | INTRAMUSCULAR | Status: DC | PRN

## 2015-01-25 MED ORDER — OXYTOCIN 40 UNITS IN LACTATED RINGERS INFUSION - SIMPLE MED
1.0000 m[IU]/min | INTRAVENOUS | Status: DC
  Administered 2015-01-25: 2 m[IU]/min via INTRAVENOUS
  Filled 2015-01-25: qty 1000

## 2015-01-25 MED ORDER — OXYCODONE-ACETAMINOPHEN 5-325 MG PO TABS: 2.0000 | ORAL_TABLET | ORAL | Status: DC | PRN

## 2015-01-25 MED ORDER — TERBUTALINE SULFATE 1 MG/ML IJ SOLN: 0.2500 mg | Freq: Once | INTRAMUSCULAR | Status: DC | PRN

## 2015-01-25 MED ORDER — ONDANSETRON HCL 4 MG/2ML IJ SOLN: 4.0000 mg | INTRAMUSCULAR | Status: DC | PRN

## 2015-01-25 MED ORDER — DIBUCAINE 1 % RE OINT
1.0000 "application " | TOPICAL_OINTMENT | RECTAL | Status: DC | PRN
  Administered 2015-01-25 – 2015-01-27 (×2): 1 via RECTAL
  Filled 2015-01-25 (×3): qty 28

## 2015-01-25 MED ORDER — CITRIC ACID-SODIUM CITRATE 334-500 MG/5ML PO SOLN: 30.0000 mL | ORAL | Status: DC | PRN

## 2015-01-25 MED ORDER — ACETAMINOPHEN 325 MG PO TABS
650.0000 mg | ORAL_TABLET | ORAL | Status: DC | PRN
  Administered 2015-01-25: 650 mg via ORAL
  Filled 2015-01-25: qty 2

## 2015-01-25 MED ORDER — SIMETHICONE 80 MG PO CHEW: 80.0000 mg | CHEWABLE_TABLET | ORAL | Status: DC | PRN

## 2015-01-25 MED ORDER — ACETAMINOPHEN 325 MG PO TABS: 650.0000 mg | ORAL_TABLET | ORAL | Status: DC | PRN

## 2015-01-25 MED ORDER — ZOLPIDEM TARTRATE 5 MG PO TABS: 5.0000 mg | ORAL_TABLET | Freq: Every evening | ORAL | Status: DC | PRN

## 2015-01-25 MED ORDER — LACTATED RINGERS IV SOLN
INTRAVENOUS | Status: DC
  Administered 2015-01-25 (×2): via INTRAVENOUS

## 2015-01-25 MED ORDER — LIDOCAINE HCL (PF) 1 % IJ SOLN
INTRAMUSCULAR | Status: DC | PRN
  Administered 2015-01-25 (×2): 8 mL via EPIDURAL

## 2015-01-25 MED ORDER — OXYCODONE-ACETAMINOPHEN 5-325 MG PO TABS
1.0000 | ORAL_TABLET | Freq: Four times a day (QID) | ORAL | Status: DC | PRN
  Administered 2015-01-25: 1 via ORAL
  Administered 2015-01-26 (×4): 2 via ORAL
  Filled 2015-01-25: qty 2
  Filled 2015-01-25: qty 1
  Filled 2015-01-25 (×3): qty 2

## 2015-01-25 MED ORDER — DIPHENHYDRAMINE HCL 25 MG PO CAPS: 25.0000 mg | ORAL_CAPSULE | Freq: Four times a day (QID) | ORAL | Status: DC | PRN

## 2015-01-25 MED ORDER — PHENYLEPHRINE 40 MCG/ML (10ML) SYRINGE FOR IV PUSH (FOR BLOOD PRESSURE SUPPORT)
80.0000 ug | PREFILLED_SYRINGE | INTRAVENOUS | Status: DC | PRN
  Filled 2015-01-25: qty 20

## 2015-01-25 MED ORDER — OXYTOCIN BOLUS FROM INFUSION
500.0000 mL | INTRAVENOUS | Status: DC
  Administered 2015-01-25: 500 mL via INTRAVENOUS

## 2015-01-25 MED ORDER — IBUPROFEN 600 MG PO TABS
600.0000 mg | ORAL_TABLET | Freq: Four times a day (QID) | ORAL | Status: DC
  Administered 2015-01-25 – 2015-01-27 (×6): 600 mg via ORAL
  Filled 2015-01-25 (×7): qty 1

## 2015-01-25 MED ORDER — LANOLIN HYDROUS EX OINT: TOPICAL_OINTMENT | CUTANEOUS | Status: DC | PRN

## 2015-01-25 MED ORDER — EPHEDRINE 5 MG/ML INJ: 10.0000 mg | INTRAVENOUS | Status: DC | PRN

## 2015-01-25 MED ORDER — NALBUPHINE HCL 10 MG/ML IJ SOLN
10.0000 mg | Freq: Four times a day (QID) | INTRAMUSCULAR | Status: DC | PRN
  Administered 2015-01-25: 10 mg via INTRAVENOUS
  Filled 2015-01-25: qty 1

## 2015-01-25 MED ORDER — PRENATAL MULTIVITAMIN CH
1.0000 | ORAL_TABLET | Freq: Every day | ORAL | Status: DC
  Administered 2015-01-26: 1 via ORAL
  Filled 2015-01-25: qty 1

## 2015-01-25 MED ORDER — LIDOCAINE HCL (PF) 1 % IJ SOLN
30.0000 mL | INTRAMUSCULAR | Status: DC | PRN
  Filled 2015-01-25: qty 30

## 2015-01-25 MED ORDER — FLEET ENEMA 7-19 GM/118ML RE ENEM: 1.0000 | ENEMA | RECTAL | Status: DC | PRN

## 2015-01-25 MED ORDER — FENTANYL 2.5 MCG/ML BUPIVACAINE 1/10 % EPIDURAL INFUSION (WH - ANES)
14.0000 mL/h | INTRAMUSCULAR | Status: DC | PRN
  Administered 2015-01-25 (×2): 14 mL/h via EPIDURAL
  Filled 2015-01-25 (×2): qty 125

## 2015-01-25 MED ORDER — LACTATED RINGERS IV SOLN: 500.0000 mL | INTRAVENOUS | Status: DC | PRN

## 2015-01-25 MED ORDER — TETANUS-DIPHTH-ACELL PERTUSSIS 5-2.5-18.5 LF-MCG/0.5 IM SUSP: 0.5000 mL | Freq: Once | INTRAMUSCULAR | Status: DC

## 2015-01-25 MED ORDER — BENZOCAINE-MENTHOL 20-0.5 % EX AERO
1.0000 "application " | INHALATION_SPRAY | CUTANEOUS | Status: DC | PRN
  Administered 2015-01-25 – 2015-01-27 (×2): 1 via TOPICAL
  Filled 2015-01-25 (×3): qty 56

## 2015-01-25 MED ORDER — ONDANSETRON HCL 4 MG PO TABS: 4.0000 mg | ORAL_TABLET | ORAL | Status: DC | PRN

## 2015-01-25 MED ORDER — WITCH HAZEL-GLYCERIN EX PADS: 1.0000 "application " | MEDICATED_PAD | CUTANEOUS | Status: DC | PRN

## 2015-01-25 MED ORDER — OXYCODONE-ACETAMINOPHEN 5-325 MG PO TABS: 1.0000 | ORAL_TABLET | ORAL | Status: DC | PRN

## 2015-01-25 MED ORDER — ONDANSETRON HCL 4 MG/2ML IJ SOLN: 4.0000 mg | Freq: Four times a day (QID) | INTRAMUSCULAR | Status: DC | PRN

## 2015-01-25 MED ORDER — SENNOSIDES-DOCUSATE SODIUM 8.6-50 MG PO TABS
2.0000 | ORAL_TABLET | ORAL | Status: DC
  Administered 2015-01-26 (×2): 2 via ORAL
  Filled 2015-01-25 (×2): qty 2

## 2015-01-25 NOTE — Anesthesia Procedure Notes (Signed)
Epidural Patient location during procedure: OB Start time: 01/25/2015 3:59 AM End time: 01/25/2015 4:03 AM  Staffing Anesthesiologist: Leilani Able Performed by: anesthesiologist   Preanesthetic Checklist Completed: patient identified, surgical consent, pre-op evaluation, timeout performed, IV checked, risks and benefits discussed and monitors and equipment checked  Epidural Patient position: sitting Prep: site prepped and draped and DuraPrep Patient monitoring: continuous pulse ox and blood pressure Approach: midline Location: L3-L4 Injection technique: LOR air  Needle:  Needle type: Tuohy  Needle gauge: 17 G Needle length: 9 cm and 9 Needle insertion depth: 5 cm cm Catheter type: closed end flexible Catheter size: 19 Gauge Catheter at skin depth: 10 cm Test dose: negative and Other  Assessment Sensory level: T9 Events: blood not aspirated, injection not painful, no injection resistance, negative IV test and no paresthesia  Additional Notes Reason for block:procedure for pain

## 2015-01-25 NOTE — Anesthesia Preprocedure Evaluation (Signed)
Anesthesia Evaluation  Patient identified by MRN, date of birth, ID band Patient awake    Reviewed: Allergy & Precautions, H&P , Patient's Chart, lab work & pertinent test results  Airway Mallampati: I  TM Distance: >3 FB Neck ROM: full    Dental no notable dental hx.    Pulmonary former smoker,    Pulmonary exam normal       Cardiovascular negative cardio ROS Normal cardiovascular exam    Neuro/Psych negative neurological ROS  negative psych ROS   GI/Hepatic Neg liver ROS,   Endo/Other  negative endocrine ROS  Renal/GU      Musculoskeletal   Abdominal Normal abdominal exam  (+)   Peds  Hematology negative hematology ROS (+)   Anesthesia Other Findings   Reproductive/Obstetrics (+) Pregnancy                             Anesthesia Physical Anesthesia Plan  ASA: II  Anesthesia Plan: Epidural   Post-op Pain Management:    Induction:   Airway Management Planned:   Additional Equipment:   Intra-op Plan:   Post-operative Plan:   Informed Consent: I have reviewed the patients History and Physical, chart, labs and discussed the procedure including the risks, benefits and alternatives for the proposed anesthesia with the patient or authorized representative who has indicated his/her understanding and acceptance.     Plan Discussed with:   Anesthesia Plan Comments:         Anesthesia Quick Evaluation

## 2015-01-25 NOTE — Progress Notes (Signed)
Latasha Mitchell is a 19 y.o. [phone] at [redacted]w[redacted]d  admitted for rupture of membranes  Subjective: Comfortable w/ epidural  Objective: BP 110/61 mmHg  Pulse 65  Temp(Src) 98 F (36.7 C) (Oral)  Resp 20  Ht  (1.626 m)  Wt 69.4 kg (153 lb)  BMI 26.25 kg/m2  SpO2 100%  LMP 04/27/2014      FHT:  FHR: 120s bpm, variability: moderate,  accelerations:  Present,  decelerations:  Absent UC:   irreg SVE:   Dilation: 3 Effacement (%): 80, 90 Station: -2 Exam by:: e. poore, rn  Labs: Lab Results  Component Value Date   WBC 10.6* 01/25/2015   HGB 10.4* 01/25/2015   HCT 30.3* 01/25/2015   MCV 87.8 01/25/2015   PLT 165 01/25/2015    Assessment / Plan: PROM Desires VBAC  Will start Pitocin now that she is comfortable w/ an epidural and there hasn't been significant cx change  SHAW, KIMBERLY CNM 01/25/2015, 7:48 AM

## 2015-01-25 NOTE — Plan of Care (Signed)
Problem: Consults Goal: Birthing Suites Patient Information Press F2 to bring up selections list  Outcome: Completed/Met Date Met:  01/25/15  Pt 37-[redacted] weeks EGA and Other (specify with a note) TOLAC

## 2015-01-25 NOTE — Progress Notes (Signed)
Dr Freida Busman at bedside. Discussed risks/benefits of TOLAC. Patient had no questions or concerns at this time. VBAC consent signed.

## 2015-01-25 NOTE — Progress Notes (Signed)
Having more variable decels with contractions. Still having accels and good variability.  IUPC placed without difficulty.  Dilation: Lip/rim Effacement (%): 100 Cervical Position: Anterior Station: +2 Presentation: Vertex Exam by:: Enis Slipper, RN   Will continue to monitor.  Anticipate NSVD.  Marcy Siren, D.O. 01/25/2015, 12:16 PM PGY-1, [facility]

## 2015-01-25 NOTE — Progress Notes (Signed)
Reviewed fetal tracing remotely and noted  130/mod/+accels/ recurrent variables to 80s with every contraction. Patient is currently 9.5/c/+2   Discussed fetal monitoring with deep recurrent variables with resident. Resident will place IUPC for improved monitoring of contraction pattern especially in the setting of previous CSection.   Federico Flake, MD

## 2015-01-25 NOTE — Progress Notes (Signed)
Labor Progress Note  S: comfortable with epidural, feeling some pelvic pressure with contractions  O:  BP 131/66 mmHg  Pulse 65  Temp(Src) 98.9 F (37.2 C) (Oral)  Resp 18  Ht  (1.626 m)  Wt 69.4 kg (153 lb)  BMI 26.25 kg/m2  SpO2 100%  LMP 04/27/2014 FHR: baseline: 130 bpm, good variability, +15x15 accels, variable decels Toco: q1-5 min CVE: Dilation: Lip/rim Effacement (%): 100 Cervical Position: Anterior Station: +2 Presentation: Vertex Exam by:: Enis Slipper, RN  A&P: 19 y.o. [id number] [redacted]w[redacted]d SROM  #labor: progressing very well, pitocin at 14 #FWB: Cat I #Anticipate NSVD   De Hollingshead, DO 10:51 AM  OB fellow attestation: I have seen and examined this patient; I agree with above documentation in the resident's note. I personally reviewed the EFM and noted deep recurrent variables to the 80s with contractions.  Resident will place IUPC for improved contraction monitoring in the setting of previous c-section.   Federico Flake, MD 12:25 PM

## 2015-01-25 NOTE — H&P (Signed)
Latasha Mitchell is a 19 y.o. female [phone] @ 38.5wks presenting for SROM at 2330. She is currently reporting contractions q4-5 min. She states that her pregnancy was complicated by severe UTI/pyelo, though she did not require admission. She denies sig abd pain. Maternal Medical History:  Reason for admission: Rupture of membranes.  Nausea.  Contractions: Onset was 1-2 hours ago.   Frequency: regular.   Duration is approximately 1 minute.   Perceived severity is mild.    Fetal activity: Perceived fetal activity is normal.    Prenatal complications: No bleeding.   Prenatal Complications - Diabetes: none.    OB History    Gravida Para Term Preterm AB TAB SAB Ectopic Multiple Living   4 1 1  2  0 2 0 0 1     Past Medical History  Diagnosis Date  . Bronchitis   . UTI (urinary tract infection)   . Kidney infection    Past Surgical History  Procedure Laterality Date  . Dilation and curettage of uterus    . Cesarean section     Family History: family history is not on file. Social History:  reports that she has quit smoking. She has never used smokeless tobacco. She reports that she does not drink alcohol or use illicit drugs.   [facility] Prenatal Labs  Dating 22 week [place] Blood type: O/POS/-- (04/27 1156)   Genetic Screen Quad: Too late     Antibody:NEG (04/27 1156)  Anatomic [place] Normal female Rubella: 1.20 (04/27 1156)  GTT Early:               Third trimester: 86 RPR: NON REAC (04/27 1156)   Flu vaccine 10/18/14 HBsAg: NEGATIVE (04/27 1156)   TDaP vaccine      May 2016                               Rhogam: NA HIV: NONREACTIVE (04/27 1156)   GBS                                              (For PCN allergy, check sensitivities) GBS:   Contraception  Nexplanon Pap: <21  Baby Food  Breast (supply issues last time)   Circumcision  n/a   Pediatrician  [facility]   Support Person  boyfriend       Prenatal Transfer Tool  Maternal Diabetes: No Genetic Screening:  Declined Maternal Ultrasounds/Referrals: Normal Fetal Ultrasounds or other Referrals:  None Maternal Substance Abuse:  Yes:  Type: Marijuana Significant Maternal Medications:  None Significant Maternal Lab Results:  None Other Comments:  None  Review of Systems  Constitutional: Negative for fever.  Respiratory: Negative for shortness of breath.   Cardiovascular: Negative for chest pain and leg swelling.  Gastrointestinal: Negative for nausea, vomiting, abdominal pain and constipation.  Genitourinary: Negative for urgency and frequency.  Neurological: Negative for weakness and headaches.      Blood pressure 131/71, pulse 92, temperature 98 F (36.7 C), temperature source Oral, resp. rate 16, height 5\' 4"  (1.626 m), weight 69.4 kg (153 lb), last menstrual period 04/27/2014, SpO2 100 %. Maternal Exam:  Uterine Assessment: Contraction strength is moderate.  Contraction duration is 30 seconds. Contraction frequency is regular.   Abdomen: Patient reports no abdominal tenderness. Surgical scars: low transverse.   Fetal  presentation: vertex  Introitus: Normal vulva. Normal vagina.  Vagina is negative for discharge.  Ferning test: positive.  Amniotic fluid character: clear.  Cervix: Cervix evaluated by digital exam.     Fetal Exam Fetal Monitor Review: Baseline rate: 120s.  Variability: moderate (6-25 bpm).   Pattern: no accelerations and no decelerations.    Fetal State Assessment: Category I - tracings are normal.     Physical Exam  Constitutional: She is oriented to person, place, and time. She appears well-developed and well-nourished. No distress.  HENT:  Head: Normocephalic and atraumatic.  Eyes: Conjunctivae and EOM are normal.  Neck: Normal range of motion. No tracheal deviation present.  Cardiovascular: Normal rate, regular rhythm and normal heart sounds.  Exam reveals no gallop and no friction rub.   No murmur heard. Respiratory: Effort normal and breath sounds  normal. No respiratory distress. She has no wheezes. She has no rales. She exhibits no tenderness.  GI: Soft. Bowel sounds are normal. She exhibits no distension. There is no tenderness. There is no rebound and no guarding.  Genitourinary: Vagina normal and uterus normal. No vaginal discharge found.  Musculoskeletal: Normal range of motion. She exhibits no edema or tenderness.  Neurological: She is alert and oriented to person, place, and time.  Skin: Skin is warm and dry. She is not diaphoretic.  Psychiatric: Her behavior is normal. Judgment and thought content normal.  Dilation: 3 Effacement (%): 50 Cervical Position: Posterior Station: -3 Presentation: Vertex Exam by:: Dr. Freida Busman    Prenatal labs: ABO, Rh: O/POS/-- (04/27 1156) Antibody: NEG (04/27 1156) Rubella: 1.20 (04/27 1156) RPR: NON REAC (05/25 1132)  HBsAg: NEGATIVE (04/27 1156)  HIV: NONREACTIVE (05/25 1132)  GBS:     Assessment/Plan: Pt is a 19 yo f G4P1021 at [redacted]w[redacted]d who presents w/ SROM approx 2hrs ago. She is currently having contractions q4-5 min. Her pregnancy was complicated by UTI/pyelo x1, marijuana use. GBS status is unknown. Ferning pos. Previous LTCS 2/2 failure to progress.  #standard admit labor orders in place #pt wanting epidural #female/breast/nexplanon #GBS pcr pending  Latasha Mitchell 01/25/2015, 12:20 AM   CNM attestation:  I have seen and examined this patient; I agree with above documentation in the resident's note.   Latasha Mitchell is a 19 y.o. [phone] here for SROM/possibly early labor  PE: BP 109/69 mmHg  Pulse 100  Temp(Src) 98.5 F (36.9 C) (Oral)  Resp 18  Ht  (1.626 m)  Wt 69.4 kg (153 lb)  BMI 26.25 kg/m2  SpO2 100%  LMP 04/27/2014 Gen: calm comfortable, NAD Resp: normal effort, no distress Abd: gravid  ROS, labs, PMH reviewed  Plan: Admit to [employer] Des[facility] VBAC Expectant management GBS PCR collected  Cam Hai 01/25/2015, 2:38 AM

## 2015-01-26 ENCOUNTER — Encounter (HOSPITAL_COMMUNITY): Payer: Self-pay | Admitting: *Deleted

## 2015-01-26 MED ORDER — IBUPROFEN 600 MG PO TABS: 600.0000 mg | ORAL_TABLET | Freq: Four times a day (QID) | ORAL | Status: DC

## 2015-01-26 MED ORDER — PNEUMOCOCCAL VAC POLYVALENT 25 MCG/0.5ML IJ INJ
0.5000 mL | INJECTION | Freq: Once | INTRAMUSCULAR | Status: DC
  Filled 2015-01-26: qty 0.5

## 2015-01-26 MED ORDER — DOCUSATE SODIUM 100 MG PO CAPS: 100.0000 mg | ORAL_CAPSULE | Freq: Two times a day (BID) | ORAL | Status: DC

## 2015-01-26 NOTE — Lactation Note (Signed)
This note was copied from the chart of Latasha Denny Mitton. Lactation Consultation Note  Patient Name: Latasha Mitchell Today's Date: 01/26/2015 Reason for consult: Initial assessment Mom is not sure if she will continue to BF. She reports nipple tenderness with nursing. Some bruising left nipple, LC assisted Mom with positioning and obtaining more depth with latch. Mom reports improvement. Mom's left nipple is erect with stimulation but will become flat with compression. Hand pump given with instructions use/clean and to use to pre-pump to help with latch. Encouraged Mom to keep working with baby at the breast. To call for assist till tenderness improves. Care for sore nipples reviewed, comfort gels given with instructions. Advised baby should be at the breast 8-12 times in 24 hours and with feeding ques. Basic teaching reviewed. Advised not to use marijuana when BF, risk factors reviewed and hand out given. Lactation brochure left for review, advised of OP services and support group. Encouraged to call for assist.   Maternal Data Has patient been taught Hand Expression?: Yes Does the patient have breastfeeding experience prior to this delivery?: Yes  Feeding Feeding Type: Breast Fed  LATCH Score/Interventions Latch: Grasps breast easily, tongue down, lips flanged, rhythmical sucking. Intervention(s): Adjust position;Assist with latch;Breast massage;Breast compression  Audible Swallowing: A few with stimulation  Type of Nipple: Everted at rest and after stimulation (left will flatten w/breast compression/erect w//stimulaton)  Comfort (Breast/Nipple): Filling, red/small blisters or bruises, mild/mod discomfort  Problem noted: Mild/Moderate discomfort;Cracked, bleeding, blisters, bruises (bruising left nipple) Interventions  (Cracked/bleeding/bruising/blister): Expressed breast milk to nipple Interventions (Mild/moderate discomfort): Comfort gels;Pre-pump if needed  Hold (Positioning):  Assistance needed to correctly position infant at breast and maintain latch. Intervention(s): Breastfeeding basics reviewed;Support Pillows;Position options;Skin to skin  LATCH Score: 7  Lactation Tools Discussed/Used Tools: Comfort gels;Pump Breast pump type: Manual WIC Program: Yes   Consult Status Consult Status: Follow-up Date: 01/27/15 Follow-up type: In-patient    Alfred Levins 01/26/2015, 8:19 PM

## 2015-01-26 NOTE — Discharge Summary (Signed)
Obstetric Discharge Summary Reason for Admission: rupture of membranes Prenatal Procedures: none Intrapartum Procedures: spontaneous vaginal delivery Postpartum Procedures: none Complications-Operative and Postpartum: none  At 1:49 PM a viable and healthy female was delivered via VBAC, Spontaneous (Presentation: Right Occiput Anterior, compound presentation with right hand by face). APGAR: 9, 9; weight .  Placenta status: Intact, Spontaneous. Cord: 3 vessels with the following complications: None. Cord pH: N/A  Anesthesia: Epidural  Episiotomy: None Lacerations: shallow 1st, hemostatic and unrepaired. Left labial, hemostatic Suture Repair: none Est. Blood Loss (mL): 283  Hospital Course:  Active Problems:   Normal delivery   Latasha Mitchell is a 19 y.o. [id number] s/p SVD.  Patient was admitted 8/4 for SROM.  She has postpartum course that was uncomplicated including no problems with ambulating, PO intake, urination, pain, or bleeding. The pt feels ready to go home and  will be discharged with outpatient follow-up.   Today: No acute events overnight.  Pt denies problems with ambulating, voiding or po intake.  She denies nausea or vomiting.  Pain is well controlled.  She has had flatus. She has not had bowel movement.  Lochia Small.  Plan for birth control is  nexplanon.  Method of Feeding: Breast  Physical Exam:  General: alert and cooperative Lochia: appropriate Uterine Fundus: firm, below umbilicus DVT Evaluation: No evidence of DVT seen on physical exam.  H/H: Lab Results  Component Value Date/Time   HGB 10.4* 01/25/2015 01:02 AM   HCT 30.3* 01/25/2015 01:02 AM    Discharge Diagnoses: Term Pregnancy-delivered  Discharge Information: Date: 01/26/2015 Activity: pelvic rest Diet: routine  Medications: PNV, Ibuprofen and Colace Breast feeding:  Yes Condition: stable Instructions: refer to handout Discharge to: home      Medication List    ASK your doctor  about these medications        albuterol 108 (90 BASE) MCG/ACT inhaler  Commonly known as:  PROVENTIL HFA;VENTOLIN HFA  Inhale 2 puffs into the lungs every 6 (six) hours as needed for shortness of breath.     cyclobenzaprine 5 MG tablet  Commonly known as:  FLEXERIL  Take 1 tablet by mouth 3 (three) times daily as needed for muscle spasms.     PRENATAL VITAMINS PLUS 27-1 MG Tabs  Take 1 tablet by mouth daily.        De Hollingshead ,MD OB Fellow 01/26/2015,7:59 AM  OB fellow attestation I have seen and examined this patient and agree with above documentation in the resident's note.  Latasha Mitchell is a 19 y.o. [id number] s/p NSVD.   Pain is well controlled.  Plan for birth control is Nexplanon.  Method of Feeding: Breast  PE:  BP 104/53 mmHg  Pulse 47  Temp(Src) 97.8 F (36.6 C) (Oral)  Resp 16  Ht  (1.626 m)  Wt 153 lb (69.4 kg)  BMI 26.25 kg/m2  SpO2 100%  LMP 04/27/2014  Breastfeeding? Unknown Fundus firm  Recent Labs  01/25/15 0102  HGB 10.4*  HCT 30.3*   Plan: discharge today - postpartum care discussed - f/u clinic in 6 weeks for postpartum visit Federico Flake, MD 1:16 PM

## 2015-01-26 NOTE — Discharge Instructions (Signed)

## 2015-01-26 NOTE — Anesthesia Postprocedure Evaluation (Signed)
  Anesthesia Post-op Note  Patient: Latasha Mitchell  Procedure(s) Performed: * No procedures listed *  Patient Location: PACU and Mother/Baby  Anesthesia Type:Epidural  Level of Consciousness: awake, alert  and oriented  Airway and Oxygen Therapy: Patient Spontanous Breathing  Post-op Pain: mild  Post-op Assessment: Post-op Vital signs reviewed              Post-op Vital Signs: Reviewed and stable  Last Vitals:  Filed Vitals:   01/26/15 0615  BP: 104/53  Pulse: 47  Temp: 36.6 C  Resp: 16    Complications: No apparent anesthesia complications

## 2015-01-26 NOTE — Clinical Social Work Maternal (Signed)
  CLINICAL SOCIAL WORK MATERNAL/CHILD NOTE  Patient Details  Name: Anjannette M Cuyler MRN: [id number] Date of Birth: 01/28/1996  Date:  01/26/2015  Clinical Social Worker Initiating Note:  Miles Borkowski E. Findley Vi, LCSW Date/ Time Initiated:  01/26/15/1000     Child's Name:  Delilah Sage Raynor   Legal Guardian:   (Parents: Katrece Ahlquist and John Raynor)   Need for Interpreter:  None   Date of Referral:  01/26/15     Reason for Referral:  Current Substance Use/Substance Use During Pregnancy    Referral Source:  Central Nursery   Address:  [address] [place], [place] [address]  Phone number:  [phone]   Household Members:  Parents, Siblings, Minor Children (MOB states that she and her 2 year old son, Damon, live with her mother and 2 sisters, ages 16 and 14.)   Natural Supports (not living in the home):  Spouse/significant other, Immediate Family   Professional Supports:     Employment:     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  Baby's UDS positive.  MOB gave permission for CSW to discuss anything with FOB present.  They report they are doing well and that baby is also well.  They hope for an early discharge today.  CSW inquired about her emotional health after the birth of her first child and MOB reports no concerns with perinatal mood disorders.  Parents were attentive to information given regarding signs and symptoms to watch for and MOB commits to  calling her doctor if she has concerns at any time.   CSW provided safe sleep/SIDS prevention education.  Parents state they have everything they need for baby at home, including a bassinet for her to sleep in.  They were attentive and understanding of safe sleep information given. CSW inquired about MOB's hx of marijuana use and informed parents of baby's positive UDS for THC.  MOB explained that she would smoke marijuana "every now and then when I was really, really sick."  She states she lost 10 lbs in 2 weeks at one point in the pregnancy and that smoking marijuana helped with nausea and appetite.  She states no plans to use moving forward.  CSW explained hospital drug screen policy and mandated reporting to Child Protective Services.  Parents were understanding and do not appear to concerned about this.  CSW explained what they might expect from CPS involvement as parents deny any hx with CPS.  Positive UDS for THC should not delay discharge.  CPS worker will follow up in the home.  Parents thanked CSW for the visit and state no questions, concerns or needs at this time.  CSW Plan/Description:  Patient/Family Education , No Further Intervention Required/No Barriers to Discharge, Child Protective Service Report     Victorious Kundinger Elizabeth, LCSW 01/26/2015, 10:45 AM 

## 2015-01-27 NOTE — Discharge Summary (Signed)
Obstetric Discharge Summary Reason for Admission: rupture of membranes Prenatal Procedures: none Intrapartum Procedures: spontaneous vaginal delivery Postpartum Procedures: none Complications-Operative and Postpartum: none  At 1:49 PM a viable and healthy female was delivered via VBAC, Spontaneous (Presentation: Right Occiput Anterior, compound presentation with right hand by face). APGAR: 9, 9; weight .  Placenta status: Intact, Spontaneous. Cord: 3 vessels with the following complications: None. Cord pH: N/A  Anesthesia: Epidural  Episiotomy: None Lacerations: shallow 1st, hemostatic and unrepaired. Left labial, hemostatic Suture Repair: none Est. Blood Loss (mL): 283  Hospital Course:  Active Problems:   Normal delivery   Latasha Mitchell is a 19 y.o. [id number] s/p SVD.  Patient was admitted 8/4 for SROM.  She has postpartum course that was uncomplicated including no problems with ambulating, PO intake, urination, pain, or bleeding. The pt feels ready to go home and  will be discharged with outpatient follow-up.   Today: No acute events overnight.  Pt denies problems with ambulating, voiding or po intake.  She denies nausea or vomiting.  Pain is well controlled.  She has had flatus. She has not had bowel movement.  Lochia Small.  Plan for birth control is  nexplanon.  Method of Feeding: Breast  Physical Exam:  General: alert and cooperative Lochia: appropriate Uterine Fundus: firm, below umbilicus DVT Evaluation: No evidence of DVT seen on physical exam.  H/H: Lab Results  Component Value Date/Time   HGB 10.4* 01/25/2015 01:02 AM   HCT 30.3* 01/25/2015 01:02 AM    Discharge Diagnoses: Term Pregnancy-delivered  Discharge Information: Date: 01/27/2015 Activity: pelvic rest Diet: routine  Medications: PNV, Ibuprofen and Colace Breast feeding:  Yes Condition: stable Instructions: refer to handout Discharge to: home       Discharge Instructions    Discharge  patient    Complete by:  As directed             Medication List    TAKE these medications        albuterol 108 (90 BASE) MCG/ACT inhaler  Commonly known as:  PROVENTIL HFA;VENTOLIN HFA  Inhale 2 puffs into the lungs every 6 (six) hours as needed for shortness of breath.     cyclobenzaprine 5 MG tablet  Commonly known as:  FLEXERIL  Take 1 tablet by mouth 3 (three) times daily as needed for muscle spasms.     docusate sodium 100 MG capsule  Commonly known as:  COLACE  Take 1 capsule (100 mg total) by mouth 2 (two) times daily.     ibuprofen 600 MG tablet  Commonly known as:  ADVIL,MOTRIN  Take 1 tablet (600 mg total) by mouth every 6 (six) hours.     PRENATAL VITAMINS PLUS 27-1 MG Tabs  Take 1 tablet by mouth daily.       Follow-up Information    Follow up with [facility]. Schedule an appointment as soon as possible for a visit in 6 weeks.   Specialty:  Obstetrics and Gynecology   Why:  For postpartum visit   Contact information:   [address] [place] [address] [phone]     [name] ,MD 01/27/2015,8:30 AM   OB FELLOW DISCHARGE ATTESTATION  I have seen and examined this patient and agree with above documentation in the resident's note.   Latasha Mitchell is a 19 y.o. [id number] s/p NSVD.   Pain is well controlled.  Plan for birth control is nexplanon.  Method of Feeding: breast  PE:  BP  107/41 mmHg  Pulse 47  Temp(Src) 97.7 F (36.5 C) (Oral)  Resp 18  Ht  (1.626 m)  Wt 153 lb (69.4 kg)  BMI 26.25 kg/m2  SpO2 100%  LMP 04/27/2014  Breastfeeding? Unknown Fundus firm   Recent Labs  01/25/15 0102  HGB 10.4*  HCT 30.3*     Plan: discharge today - postpartum care discussed - f/u clinic in 6 weeks for postpartum visit   Silvano Bilis, MD 1:55 PM

## 2015-01-27 NOTE — Lactation Note (Signed)
This note was copied from the chart of Latasha Mitchell. Lactation Consultation Note  Parents state latch has improved since [facility] consult last night. Mother's breasts are filling.  Reviewed supply and demand. Encouraged breastfeeding before giving formula to establish her milk supply. Suggest she breastfeed on both breasts burping in between. Reviewed engorgement care and monitor voids/stools.  Mother has comfort gels for soreness.  Patient Name: Latasha Mindel Friscia [id number] Date: 01/27/2015 Reason for consult: Follow-up assessment   Maternal Data    Feeding Feeding Type: Bottle Fed - Formula Nipple Type: Slow - flow Length of feed: 10 min  LATCH Score/Interventions                      Lactation Tools Discussed/Used     Consult Status Consult Status: Complete    Hardie Pulley 01/27/2015, 10:12 AM

## 2015-01-27 NOTE — Discharge Summary (Deleted)
Obstetric Discharge Summary Reason for Admission: onset of labor Prenatal Procedures: none Intrapartum Procedures: spontaneous vaginal delivery Postpartum Procedures: none Complications-Operative and Postpartum: none  Delivery Note At 1:49 PM a viable and healthy female was delivered via VBAC, Spontaneous (Presentation: Right Occiput Anterior, compound presentation with right hand by face). APGAR: 9, 9; weight .  Placenta status: Intact, Spontaneous. Cord: 3 vessels with the following complications: None. Cord pH: N/A  Anesthesia: Epidural  Episiotomy: None Lacerations: shallow 1st, hemostatic and unrepaired. Left labial, hemostatic Suture Repair: none Est. Blood Loss (mL): 283  Mom to postpartum. Baby to Couplet care / Skin to Skin.  Hospital Course:  Active Problems:   Normal delivery   Latasha Mitchell is a 19 y.o. [id number] s/p SVD.  Patient was admitted 8/4.  She has postpartum course that was uncomplicated including no problems with ambulating, PO intake, urination, pain, or bleeding. The pt feels ready to go home and  will be discharged with outpatient follow-up.   Today: No acute events overnight.  Pt denies problems with ambulating, voiding or po intake.  She denies nausea or vomiting.  Pain is well controlled.  She has had flatus. She has had bowel movement.  Lochia Minimal.  Plan for birth control is  nexplanon.  Method of Feeding: breast  Physical Exam:  General: alert, cooperative and appears stated age 19: appropriate Uterine Fundus: firm DVT Evaluation: No evidence of DVT seen on physical exam.  H/H: Lab Results  Component Value Date/Time   HGB 10.4* 01/25/2015 01:02 AM   HCT 30.3* 01/25/2015 01:02 AM    Discharge Diagnoses: Term Pregnancy-delivered  Discharge Information: Date: 01/27/2015 Activity: pelvic rest Diet: routine  Medications: PNV and Ibuprofen Breast feeding:  Yes Condition: stable Instructions: refer to handout Discharge to:  home       Discharge Instructions    Discharge patient    Complete by:  As directed             Medication List    TAKE these medications        albuterol 108 (90 BASE) MCG/ACT inhaler  Commonly known as:  PROVENTIL HFA;VENTOLIN HFA  Inhale 2 puffs into the lungs every 6 (six) hours as needed for shortness of breath.     cyclobenzaprine 5 MG tablet  Commonly known as:  FLEXERIL  Take 1 tablet by mouth 3 (three) times daily as needed for muscle spasms.     docusate sodium 100 MG capsule  Commonly known as:  COLACE  Take 1 capsule (100 mg total) by mouth 2 (two) times daily.     ibuprofen 600 MG tablet  Commonly known as:  ADVIL,MOTRIN  Take 1 tablet (600 mg total) by mouth every 6 (six) hours.     PRENATAL VITAMINS PLUS 27-1 MG Tabs  Take 1 tablet by mouth daily.       Follow-up Information    Follow up with [facility]. Schedule an appointment as soon as possible for a visit in 6 weeks.   Specialty:  Obstetrics and Gynecology   Why:  For postpartum visit   Contact information:   [address] [place] [address] [phone]      [name] ,MD 01/27/2015,8:29 AM

## 2015-01-31 ENCOUNTER — Encounter: Payer: Medicaid Other | Admitting: Advanced Practice Midwife

## 2015-03-05 ENCOUNTER — Ambulatory Visit: Payer: Medicaid Other | Admitting: Obstetrics & Gynecology

## 2015-04-06 ENCOUNTER — Inpatient Hospital Stay (HOSPITAL_COMMUNITY)
Admission: AD | Admit: 2015-04-06 | Discharge: 2015-04-06 | Disposition: A | Discharge status: Home / Self Care | Payer: Medicaid Other | Source: Ambulatory Visit | Attending: Obstetrics & Gynecology | Admitting: Obstetrics & Gynecology

## 2015-04-06 ENCOUNTER — Ambulatory Visit: Payer: Medicaid Other | Admitting: Obstetrics and Gynecology

## 2015-04-06 ENCOUNTER — Encounter (HOSPITAL_COMMUNITY): Payer: Self-pay | Admitting: *Deleted

## 2015-04-06 DIAGNOSIS — R32 Unspecified urinary incontinence: Secondary | ICD-10-CM | POA: Insufficient documentation

## 2015-04-06 LAB — URINALYSIS, ROUTINE W REFLEX MICROSCOPIC
Bilirubin Urine: NEGATIVE
Glucose, UA: NEGATIVE mg/dL
Ketones, ur: NEGATIVE mg/dL
Nitrite: NEGATIVE
Protein, ur: NEGATIVE mg/dL
SPECIFIC GRAVITY, URINE: 1.02 (ref 1.005–1.030)
UROBILINOGEN UA: 0.2 mg/dL (ref 0.0–1.0)
pH: 6 (ref 5.0–8.0)

## 2015-04-06 LAB — URINE MICROSCOPIC-ADD ON

## 2015-04-06 LAB — WET PREP, GENITAL
CLUE CELLS WET PREP: NONE SEEN
Trich, Wet Prep: NONE SEEN
YEAST WET PREP: NONE SEEN

## 2015-04-06 LAB — POCT PREGNANCY, URINE: Preg Test, Ur: NEGATIVE

## 2015-04-06 NOTE — Discharge Instructions (Signed)
Urinary Incontinence  Urinary incontinence is the involuntary loss of urine from your bladder.  CAUSES   There are many causes of urinary incontinence. They include:  1. Medicines.  2. Infections.  3. Prostatic enlargement, leading to overflow of urine from your bladder.  4. Surgery.  5. Neurological diseases.  6. Emotional factors.  SIGNS AND SYMPTOMS  Urinary Incontinence can be divided into four types:  · Urge incontinence. Urge incontinence is the involuntary loss of urine before you have the opportunity to go to the bathroom. There is a sudden urge to void but not enough time to reach a bathroom.  · Stress incontinence. Stress incontinence is the sudden loss of urine with any activity that forces urine to pass. It is commonly caused by anatomical changes to the pelvis and sphincter areas of your body.  · Overflow incontinence. Overflow incontinence is the loss of urine from an obstructed opening to your bladder. This results in a backup of urine and a resultant buildup of pressure within the bladder. When the pressure within the bladder exceeds the closing pressure of the sphincter, the urine overflows, which causes incontinence, similar to water overflowing a dam.  · Total incontinence. Total incontinence is the loss of urine as a result of the inability to store urine within your bladder.  DIAGNOSIS   Evaluating the cause of incontinence may require:  · A thorough and complete medical and obstetric history.  · A complete physical exam.  · Laboratory tests such as a urine culture and sensitivities.  When additional tests are indicated, they can include:  · An ultrasound exam.  · Kidney and bladder X-rays.  · Cystoscopy. This is an exam of the bladder using a narrow scope.  · Urodynamic testing to test the nerve function to the bladder and sphincter areas.  TREATMENT   Treatment for urinary incontinence depends on the cause:  · For urge incontinence caused by a bacterial infection, antibiotics will be  prescribed. If the urge incontinence is related to medicines you take, your health care provider may have you change the medicine.  · For stress incontinence, surgery to re-establish anatomical support to the bladder or sphincter, or both, will often correct the condition.  · For overflow incontinence caused by an enlarged prostate, an operation to open the channel through the enlarged prostate will allow the flow of urine out of the bladder. In women with fibroids, a hysterectomy may be recommended.  · For total incontinence, surgery on your urinary sphincter may help. An artificial urinary sphincter (an inflatable cuff placed around the urethra) may be required. In women who have developed a hole-like passage between their bladder and vagina (vesicovaginal fistula), surgery to close the fistula often is required.  HOME CARE INSTRUCTIONS  · Normal daily hygiene and the use of pads or adult diapers that are changed regularly will help prevent odors and skin damage.  · Avoid caffeine. It can overstimulate your bladder.  · Use the bathroom regularly. Try about every 2-3 hours to go to the bathroom, even if you do not feel the need to do so. Take time to empty your bladder completely. After urinating, wait a minute. Then try to urinate again.  · For causes involving nerve dysfunction, keep a log of the medicines you take and a journal of the times you go to the bathroom.  SEEK MEDICAL CARE IF:  · You experience worsening of pain instead of improvement in pain after your procedure.  · Your incontinence becomes   worse instead of better.  SEE IMMEDIATE MEDICAL CARE IF:  · You experience fever or shaking chills.  · You are unable to pass your urine.  · You have redness spreading into your groin or down into your thighs.  MAKE SURE YOU:  · ?Understand these instructions.    · Will watch your condition.  · Will get help right away if you are not doing well or get worse.     This information is not intended to replace advice  given to you by your health care provider. Make sure you discuss any questions you have with your health care provider.     Document Released: 07/17/2004 Document Revised: 06/30/2014 Document Reviewed: 11/16/2012  Elsevier Interactive Patient Education ©2016 Else[employer]  Kegel Exercises  The goal of Kegel exercises is to isolate and exercise your pelvic floor muscles. These muscles act as a hammock that supports the rectum, vagina, small intestine, and uterus. As the muscles weaken, the hammock sags and these organs are displaced from their normal positions. Kegel exercises can strengthen your pelvic floor muscles and help you to improve bladder and bowel control, improve sexual response, and help reduce many problems and some discomfort during pregnancy. Kegel exercises can be done anywhere and at any time.  HOW TO PERFORM KEGEL EXERCISES  7. Locate your pelvic floor muscles. To do this, squeeze (contract) the muscles that you use when you try to stop the flow of urine. You will feel a tightness in the vaginal area (women) and a tight lift in the rectal area (men and women).  8. When you begin, contract your pelvic muscles tight for 2-5 seconds, then relax them for 2-5 seconds. This is one set. Do 4-5 sets with a short pause in between.  9. Contract your pelvic muscles for 8-10 seconds, then relax them for 8-10 seconds. Do 4-5 sets. If you cannot contract your pelvic muscles for 8-10 seconds, try 5-7 seconds and work your way up to 8-10 seconds. Your goal is 4-5 sets of 10 contractions each day.  Keep your stomach, buttocks, and legs relaxed during the exercises. Perform sets of both short and long contractions. Vary your positions. Perform these contractions 3-4 times per day. Perform sets while you are:    · Lying in bed in the morning.  · Standing at lunch.  · Sitting in the late afternoon.  · Lying in bed at night.   You should do 40-50 contractions per day. Do not perform more Kegel exercises per day than  recommended. Overexercising can cause muscle fatigue. Continue these exercises for for at least 15-20 weeks or as directed by your caregiver.     This information is not intended to replace advice given to you by your health care provider. Make sure you discuss any questions you have with your health care provider.     Document Released: 05/26/2012 Document Revised: 06/30/2014 Document Reviewed: 05/26/2012  Elsevier Interactive Patient Education ©2016 Elsevier Inc.

## 2015-04-06 NOTE — MAU Provider Note (Signed)
History     CSN: [id number]  Arrival date and time: 04/06/15 4098   First Provider Initiated Contact with Patient 04/06/15 [phone]      Chief Complaint  Patient presents with  . Urinary Incontinence   HPI Latasha Mitchell 19 y.o. [id number] nonpregnant female presents to MAU complaining of urinary incontinence.  Ever since her vaginal delivery 2 months ago, she is having frequent leaking of urine.  Any time she coughs or often when she walks, she has urine leakage - even if she has just gone to the bathroom.  She denies pain on urination.  She notes that her vagina has a bit of a "healing" smell - same as after her miscarriage.  NO odor to urine.  She did not have tearing with delivery.  She denies weakness, fever, nausea, vomiting.  She is bleeding today as expected.   OB History    Gravida Para Term Preterm AB TAB SAB Ectopic Multiple Living   0 2 0 2 0 0 1      Past Medical History  Diagnosis Date  . Bronchitis   . UTI (urinary tract infection)   . Kidney infection   . History of marijuana use   . Former smoker   . Late prenatal care     at 24 wks    Past Surgical History  Procedure Laterality Date  . Dilation and curettage of uterus    . Cesarean section      Family History  Problem Relation Age of Onset  . Heart disease Maternal Grandfather   . Diabetes Paternal Grandmother   . Hypertension Paternal Grandmother   . Stroke Paternal Grandmother   . Heart disease Paternal Grandfather     Social History  Substance Use Topics  . Smoking status: Current Every Day Smoker -- 0.25 packs/day    Types: Cigarettes  . Smokeless tobacco: Never Used  . Alcohol Use: No    Allergies: No Known Allergies  Prescriptions prior to admission  Medication Sig Dispense Refill Last Dose  . albuterol (PROVENTIL HFA;VENTOLIN HFA) 108 (90 BASE) MCG/ACT inhaler Inhale 2 puffs into the lungs every 6 (six) hours as needed for shortness of breath. 1 Inhaler 1 Past Month at Unknown  time  . cyclobenzaprine (FLEXERIL) 5 MG tablet Take 1 tablet by mouth 3 (three) times daily as needed for muscle spasms.   0 prn  . docusate sodium (COLACE) 100 MG capsule Take 1 capsule (100 mg total) by mouth 2 (two) times daily. 30 capsule 0   . ibuprofen (ADVIL,MOTRIN) 600 MG tablet Take 1 tablet (600 mg total) by mouth every 6 (six) hours. 30 tablet 0   . Prenatal Vit-Fe Fumarate-FA (PRENATAL VITAMINS PLUS) 27-1 MG TABS Take 1 tablet by mouth daily. 30 tablet 6 01/24/2015 at Unknown time    ROS Pertinent ROS in HPI.  All other systems are negative.   Physical Exam   Blood pressure 118/59, pulse 50, temperature 98 F (36.7 C), temperature source Oral, resp. rate 18, weight 141 lb 3.2 oz (64.048 kg), last menstrual period 04/06/2015, unknown if currently breastfeeding.  Physical Exam  Constitutional: She is oriented to person, place, and time. She appears well-developed and well-nourished.  HENT:  Head: Normocephalic and atraumatic.  Eyes: EOM are normal.  Neck: Normal range of motion.  Cardiovascular: Normal rate.   Respiratory: No respiratory distress.  GI: Soft. She exhibits no distension. There is no tenderness. There is no rebound and no guarding.  Genitourinary:  Large dark blood in vault.  No masses or structural abnormalities identified  Musculoskeletal: Normal range of motion.  Neurological: She is alert and oriented to person, place, and time.  Skin: Skin is warm and dry.  Psychiatric: She has a normal mood and affect.   Results for orders placed or performed during the hospital encounter of 04/06/15 (from the past 24 hour(s))  Urinalysis, Routine w reflex microscopic ([facility])     Status: Abnormal   Collection Time: 04/06/15  9:20 AM  Result Value Ref Range   Color, Urine YELLOW YELLOW   APPearance CLEAR CLEAR   Specific Gravity, Urine 1.020 1.005 - 1.030   pH 6.0 5.0 - 8.0   Glucose, UA NEGATIVE NEGATIVE mg/dL   Hgb urine dipstick LARGE (A) NEGATIVE    Bilirubin Urine NEGATIVE NEGATIVE   Ketones, ur NEGATIVE NEGATIVE mg/dL   Protein, ur NEGATIVE NEGATIVE mg/dL   Urobilinogen, UA 0.2 0.0 - 1.0 mg/dL   Nitrite NEGATIVE NEGATIVE   Leukocytes, UA TRACE (A) NEGATIVE  Urine microscopic-add on     Status: None   Collection Time: 04/06/15  9:20 AM  Result Value Ref Range   Squamous Epithelial / LPF RARE RARE   WBC, UA 0-2 <3 WBC/hpf   RBC / HPF 21-50 <3 RBC/hpf   Bacteria, UA RARE RARE  Pregnancy, urine POC     Status: None   Collection Time: 04/06/15  9:30 AM  Result Value Ref Range   Preg Test, Ur NEGATIVE NEGATIVE  Wet prep, genital     Status: Abnormal   Collection Time: 04/06/15 10:17 AM  Result Value Ref Range   Yeast Wet Prep HPF POC NONE SEEN NONE SEEN   Trich, Wet Prep NONE SEEN NONE SEEN   Clue Cells Wet Prep HPF POC NONE SEEN NONE SEEN   WBC, Wet Prep HPF POC FEW (A) NONE SEEN    MAU Course  Procedures  MDM No sign of infection.   No obvious structural issue Missed her pp appt in clinic today.    Assessment and Plan  A:  1. Urinary incontinence in female     P: Discharge to home Kegel exercises F/U in clinic for PP exam and discuss at that time May require urology consult.  Info given to make appt Patient may return to MAU as needed or if her condition were to change or worsen   Bertram DenverKaren E Teague Clark 04/06/2015, 9:37 AM

## 2015-04-06 NOTE — MAU Note (Addendum)
Vag del in Aug.  No post partem check.  Can't control urine.  Stress incontinence getting worse.

## 2015-04-09 LAB — GC/CHLAMYDIA PROBE AMP (~~LOC~~) NOT AT ARMC
Chlamydia: NEGATIVE
NEISSERIA GONORRHEA: NEGATIVE

## 2015-06-06 ENCOUNTER — Encounter (HOSPITAL_COMMUNITY): Payer: Self-pay | Admitting: Emergency Medicine

## 2015-06-06 ENCOUNTER — Emergency Department (HOSPITAL_COMMUNITY): Payer: Medicaid Other

## 2015-06-06 ENCOUNTER — Emergency Department (HOSPITAL_COMMUNITY)
Admission: EM | Admit: 2015-06-06 | Discharge: 2015-06-06 | Disposition: A | Discharge status: Home / Self Care | Payer: Medicaid Other | Attending: Emergency Medicine | Admitting: Emergency Medicine

## 2015-06-06 DIAGNOSIS — Y998 Other external cause status: Secondary | ICD-10-CM | POA: Diagnosis not present

## 2015-06-06 DIAGNOSIS — Y9389 Activity, other specified: Secondary | ICD-10-CM | POA: Diagnosis not present

## 2015-06-06 DIAGNOSIS — Y9289 Other specified places as the place of occurrence of the external cause: Secondary | ICD-10-CM | POA: Diagnosis not present

## 2015-06-06 DIAGNOSIS — F17210 Nicotine dependence, cigarettes, uncomplicated: Secondary | ICD-10-CM | POA: Diagnosis not present

## 2015-06-06 DIAGNOSIS — S29002A Unspecified injury of muscle and tendon of back wall of thorax, initial encounter: Secondary | ICD-10-CM | POA: Insufficient documentation

## 2015-06-06 DIAGNOSIS — Z87448 Personal history of other diseases of urinary system: Secondary | ICD-10-CM | POA: Insufficient documentation

## 2015-06-06 DIAGNOSIS — Z791 Long term (current) use of non-steroidal anti-inflammatories (NSAID): Secondary | ICD-10-CM | POA: Diagnosis not present

## 2015-06-06 DIAGNOSIS — Z79899 Other long term (current) drug therapy: Secondary | ICD-10-CM | POA: Diagnosis not present

## 2015-06-06 DIAGNOSIS — Z8709 Personal history of other diseases of the respiratory system: Secondary | ICD-10-CM | POA: Insufficient documentation

## 2015-06-06 DIAGNOSIS — Z87440 Personal history of urinary (tract) infections: Secondary | ICD-10-CM | POA: Diagnosis not present

## 2015-06-06 DIAGNOSIS — M62838 Other muscle spasm: Secondary | ICD-10-CM

## 2015-06-06 DIAGNOSIS — M549 Dorsalgia, unspecified: Secondary | ICD-10-CM

## 2015-06-06 MED ORDER — CYCLOBENZAPRINE HCL 10 MG PO TABS: 10.0000 mg | ORAL_TABLET | Freq: Two times a day (BID) | ORAL | Status: DC | PRN

## 2015-06-06 MED ORDER — CYCLOBENZAPRINE HCL 10 MG PO TABS
5.0000 mg | ORAL_TABLET | Freq: Once | ORAL | Status: AC
  Administered 2015-06-06: 5 mg via ORAL
  Filled 2015-06-06: qty 1

## 2015-06-06 MED ORDER — HYDROCODONE-ACETAMINOPHEN 5-325 MG PO TABS: 1.0000 | ORAL_TABLET | ORAL | Status: DC | PRN

## 2015-06-06 MED ORDER — HYDROCODONE-ACETAMINOPHEN 5-325 MG PO TABS
1.0000 | ORAL_TABLET | Freq: Once | ORAL | Status: AC
  Administered 2015-06-06: 1 via ORAL
  Filled 2015-06-06: qty 1
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
## 2015-06-06 NOTE — Discharge Instructions (Signed)
Take your medications as prescribed as needed for pain relief. I also recommend applying ice for 15-20 minutes to affected area 3-4 times daily. Please follow up with a primary care provider from the Resource Guide provided below in 4-5 days. Please return to the Emergency Department if symptoms worsen or new onset of fever, numbness, tingling, saddle anesthesia, loss of bowel or bladder, weakness.    Emergency Department Resource Guide 1) Find a Doctor and Pay Out of Pocket Although you won't have to find out who is covered by your insurance plan, it is a good idea to ask around and get recommendations. You will then need to call the office and see if the doctor you have chosen will accept you as a new patient and what types of options they offer for patients who are self-pay. Some doctors offer discounts or will set up payment plans for their patients who do not have insurance, but you will need to ask so you aren't surprised when you get to your appointment.  2) Contact Your Local Health Department Not all health departments have doctors that can see patients for sick visits, but many do, so it is worth a call to see if yours does. If you don't know where your local health department is, you can check in your phone book. The CDC also has a tool to help you locate your state's health department, and many state websites also have listings of all of their local health departments.  3) Find a Walk-in Clinic If your illness is not likely to be very severe or complicated, you may want to try a walk in clinic. These are popping up all over the country in pharmacies, drugstores, and shopping centers. They're usually staffed by nurse practitioners or physician assistants that have been trained to treat common illnesses and complaints. They're usually fairly quick and inexpensive. However, if you have serious medical issues or chronic medical problems, these are probably not your best option.  No Primary  Care Doctor: - Call Health Connect at  [phone] - they can help you locate a primary care doctor that  accepts your insurance, provides certain services, etc. - Physician Referral Service- [phone]  Chronic Pain Problems: Organization         Address  Phone   Notes  [facility]  [phone]) [phone] Patients need to be referred by their primary care doctor.   Medication Assistance: Organization         Address  Phone   Notes  [facility] [address] [place], [place] [address] [phone]) [phone] --Must be a resident of [facility] [place] -- Must have NO insurance coverage whatsoever (no Medicaid/ Medicare, etc.) -- The pt. MUST have a primary care doctor that directs their care regularly and follows them in the community   MedAssist  [phone]   [employer]  [phone]    Agencies that provide inexpensive medical care: Organization         Address  Phone   Notes  [facility]  [phone]   [facility]    [phone]   [facility] [address] [place], [place] [address] [phone]) [phone]   [facility] [address], [place] [phone]) [phone]   Planned Parenthood    [phone]   [facility]    [phone](336) [phone]   [facility] and [facility]  [address], [place] Phone:  [phone], Fax:  [phone] Hours of Operation:  9 am - 6 pm, M-F.  Also accepts Medicaid/Medicare and self-pay.  [facility] for [place] [address], [address], [place] Phone: [phone], Fax: [phone]. Hours of Operation:  8:30 am - 5:30 pm, M-F.  Also accepts Medicaid and self-pay.  [facility] [place] [address], [place] Phone: [phone]   [place], [place], [place] [phone], Ext. [phone] Mondays & Thursdays: 7-9 AM.  First 15 patients are seen on a first  come, first serve basis.    [place] Providers:  Organization         Address  Phone   Notes  [facility] [address], [place] [phone] Also accepts self-pay patients.  [facility] [address], [place]  [phone]   [place], [address], [place] [phone]   [facility] [address], [place] [phone]   Lucianne Lei [address], [place]   [phone] Only accepts [place] Access [place] patients after they have their name applied to their card.   Self-Pay (no insurance) in [facility]:  Organization         Address  Phone   Notes  Sickle Cell Patients, [facility] [place] [phone]   [facility] [place] [phone]   [facility] [place]  [place], [place], [place] [phone]   [facility]/Dr. Osei-Bonsu  [address], [place] or [address], [place] [phone] Phone number for both [place] and [place] locations is the same.  Urgent Medical and [facility] [address], [place] [phone]   [facility] [address], [place] or [address] [phone]   [facility] [address], [place] [phone], phone; [phone], fax Sees patients 1st and 3rd Saturday of every month.  Must not qualify for public or private insurance (i.e. Medicaid, Medicare, [place] Health Choice, Veterans' Benefits)  Household income should be no more than 200% of the poverty level The clinic cannot treat you if you are pregnant or think you are pregnant  Sexually transmitted diseases are not treated at the clinic.    Dental Care: Organization          Address  Phone  Notes  [facility] [place] [phone] Accepts children up to age 13 who are enrolled in [place] or [place]; pregnant women with a Medicaid card; and children who have applied for Medicaid or [place] Health Choice, but were declined, whose parents can pay a reduced fee at time of service.  [facility] Department of [facility]  [address], [place] [phone] Accepts children up to age 8 who are enrolled in [place] or [place]; pregnant women with a Medicaid card; and children who have applied for Medicaid or [place] Health Choice, but were declined, whose parents can pay a reduced fee at time of service.  [facility] Adult Dental Access PROGRAM  [place] [phone] Patients are  seen by appointment only. Walk-ins are not accepted. [place] will see patients 20 years of age and older. Monday - Tuesday (8am-5pm) Most Wednesdays (8:30-5pm) $30 per visit, cash only  [facility] Adult Dental Access PROGRAM  [address], [facility] [phone] Patients are seen by appointment only. Walk-ins are not accepted. [place] will see patients 57 years of age and older. One Wednesday Evening (Monthly: Volunteer Based).  $30 per visit, cash only  [place]  [phone] for adults; Children under age 71, call Graduate Pediatric Dentistry at [phone]. Children aged 38-14, please call [phone] to request a pediatric application.  Dental services are provided in all areas of dental care including fillings, crowns and bridges, complete and partial dentures, implants, gum treatment, root canals, and extractions. Preventive care is also provided. Treatment is provided to both adults and children. Patients are selected via a lottery and there is often a waiting list.   [facility] [address], [place]  [phone] [email or web address]   [facility] [address] [place], [place] [phone], Ext. [phone] Second and Fourth Thursday of each month, opens at 6:30 AM; Clinic ends at 9 AM.  Patients are seen on a first-come first-served basis, and a limited number are seen during each clinic.   [facility]  [address] Hillard Danker [place], [place] [phone]   Eligibility Requirements You must have lived in [place], [place], or [place] counties for at least the last three months.   You cannot be eligible for state or federal sponsored [employer], including [employer], [place], or [employer].   You generally cannot be eligible for healthcare insurance through your employer.    How to apply: Eligibility screenings are held every Tuesday and Wednesday afternoon from 1:00 pm until 4:00 pm. You do not need an appointment for the interview!  [facility] [address] [place], [place]   [place]  [place] Department  [place]  [phone]    Behavioral Health Resources in the Community: Intensive Outpatient Programs Organization         Address  Phone  Notes  [place]. [address] [place], [place] [phone]   [facility] [address], [place], [place]   ADS: [facility] & Drug S[facility] [address] [place], [place]   [place] [address]  [place], [place] or [phone]   Substance Abuse Resources Organization         Address  Phone  Notes  Alcohol and Drug Services  [phone]   [place]  [phone]   The [place]   Chinita Pester  [phone]   Residential & Outpatient Substance Abuse Program  [phone]   Psychological  Services Organization         Address  Phone  Notes  [facility] [place]  [place]  [phone]   [place] [address] [place] or [phone]    Mobile Crisis Teams Organization         Address  Phone  Notes  [facility], Mobile Crisis Care Unit  [phone]   Assertive Psychotherapeutic Services  [address] [place], [place]   [facility] [address] [place]  [place] [phone]    Self-Help/Support Groups Organization         Address  Phone             Notes  [facility] [place] - variety of support groups  [place] Call for more information  Narcotics Anonymous (NA), Caring Services [address], [employer] [place]  2 meetings at this location   Special educational needs teacher         Address  Phone  Notes  ASAP Residential Treatment [place],    [place]  [phone]   [facility]  [address] [place] [phone], [place], [place]   [place], [place] [phone] Admissions: 8am-3pm M-F  Incentives Substance [place] [address],    [place], [place] [phone]   [facility] [address] [place], [place], [place]   [facility] [address]  [place], [place]   [facility] Programs - Intensive Outpatient [address] Kristeen [address], [place], [place]   [facility] ([place].,  [place], [place] [phone] or [phone]   [facility] Services (RTS) [address] [place], [place] Accepts Medicaid  [facility] [place] [address]  [place] [phone] Substance Abuse/Addiction Treatment   [facility] Organization         Address  Phone  Notes  [facility]  [phone]   Domenic Schwab, PhD [address] Arlis Port[place], [place]   [phone] or [phone]   [place] Berkley [place], [place] [phone]   [facility] [address] [place], [place] [phone] Insurance/Medicaid/sponsorship through [facility] and Families [address] [place]                                    [place], [place] [phone] [place] [address], [place] [phone]    Dr. Adele Schilder  [phone]   [facility] Dept. 1) [address] [place] 2) [place] 3)  [place] [address], [place] [phone]  [phone]   [place] [phone] or [phone] (After Hours)

## 2016-04-03 ENCOUNTER — Encounter (HOSPITAL_COMMUNITY): Payer: Self-pay

## 2016-04-03 ENCOUNTER — Inpatient Hospital Stay (HOSPITAL_COMMUNITY)
Admission: AD | Admit: 2016-04-03 | Discharge: 2016-04-03 | Disposition: A | Discharge status: Home / Self Care | Payer: Medicaid Other | Source: Ambulatory Visit | Attending: Obstetrics & Gynecology | Admitting: Obstetrics & Gynecology

## 2016-04-03 DIAGNOSIS — O99332 Smoking (tobacco) complicating pregnancy, second trimester: Secondary | ICD-10-CM | POA: Insufficient documentation

## 2016-04-03 DIAGNOSIS — O0932 Supervision of pregnancy with insufficient antenatal care, second trimester: Secondary | ICD-10-CM | POA: Diagnosis not present

## 2016-04-03 DIAGNOSIS — F17210 Nicotine dependence, cigarettes, uncomplicated: Secondary | ICD-10-CM | POA: Diagnosis not present

## 2016-04-03 DIAGNOSIS — Z3A20 20 weeks gestation of pregnancy: Secondary | ICD-10-CM | POA: Diagnosis not present

## 2016-04-03 DIAGNOSIS — M549 Dorsalgia, unspecified: Secondary | ICD-10-CM | POA: Diagnosis present

## 2016-04-03 DIAGNOSIS — T63301A Toxic effect of unspecified spider venom, accidental (unintentional), initial encounter: Secondary | ICD-10-CM | POA: Insufficient documentation

## 2016-04-03 DIAGNOSIS — O99891 Other specified diseases and conditions complicating pregnancy: Secondary | ICD-10-CM

## 2016-04-03 DIAGNOSIS — O26892 Other specified pregnancy related conditions, second trimester: Secondary | ICD-10-CM

## 2016-04-03 DIAGNOSIS — O9989 Other specified diseases and conditions complicating pregnancy, childbirth and the puerperium: Secondary | ICD-10-CM

## 2016-04-03 LAB — URINALYSIS, ROUTINE W REFLEX MICROSCOPIC
Bilirubin Urine: NEGATIVE
GLUCOSE, UA: NEGATIVE mg/dL
HGB URINE DIPSTICK: NEGATIVE
KETONES UR: NEGATIVE mg/dL
Leukocytes, UA: NEGATIVE
Nitrite: NEGATIVE
PROTEIN: NEGATIVE mg/dL
Specific Gravity, Urine: 1.02 (ref 1.005–1.030)
pH: 6.5 (ref 5.0–8.0)

## 2016-04-03 MED ORDER — CEPHALEXIN 500 MG PO CAPS: 500.0000 mg | ORAL_CAPSULE | Freq: Two times a day (BID) | ORAL | 0 refills | Status: DC

## 2016-04-03 MED ORDER — CYCLOBENZAPRINE HCL 10 MG PO TABS
10.0000 mg | ORAL_TABLET | Freq: Once | ORAL | Status: AC
  Administered 2016-04-03: 10 mg via ORAL
  Filled 2016-04-03: qty 1

## 2016-04-03 MED ORDER — CYCLOBENZAPRINE HCL 10 MG PO TABS: 5.0000 mg | ORAL_TABLET | Freq: Three times a day (TID) | ORAL | 1 refills | Status: DC | PRN

## 2016-04-03 NOTE — MAU Note (Signed)
Pt presents to MAU with complaints of lower back pain and she has a spider bite on her lower left leg. PT states that she just got out of jail and didn't know she was pregnant. FHR 150.

## 2016-04-03 NOTE — MAU Provider Note (Signed)
Chief Complaint: No chief complaint on file.   First Provider Initiated Contact with Patient 04/03/16 1404      SUBJECTIVE HPI: Latasha Mitchell is a 20 y.o. [id number] at [redacted]w[redacted]d by LMP who presents to maternity admissions reporting upper back pain x 2 weeks and spider bite of her left ankle. She was recently incarcerated and released last week. She found out she was pregnant while in jail so has not yet had prenatal care.  She reports she was under the influence of drugs when she was incarcerated and was mostly unconscious for 4 days. She came to and noticed the bite on her left ankle. This was 1 week ago.  It became more red and more inflamed over the next 2 days so she was given amoxicillin from the jail. She is unsure of the dose or length of time she took the medication.  She reports the bite is still painful and has some pus coming out of it every day.  She reports she had back pain in her previous pregnancy that was not resolved with Tylenol or muscle relaxers. So far, for this pain, she has tried Tylenol which is not helping.  She [facility] where she was seen in her previous pregnancies. She denies vaginal bleeding, vaginal itching/burning, urinary symptoms, h/a, dizziness, n/v, or fever/chills.     HPI  Past Medical History:  Diagnosis Date  . Bronchitis   . Former smoker   . History of marijuana use   . Kidney infection   . Late prenatal care    at 24 wks  . UTI (urinary tract infection)    Past Surgical History:  Procedure Laterality Date  . CESAREAN SECTION    . DILATION AND CURETTAGE OF UTERUS     Social History   Social History  . Marital status: Single    Spouse name: N/A  . Number of children: N/A  . Years of education: N/A   Occupational History  . Not on file.   Social History Main Topics  . Smoking status: Current Every Day Smoker    Packs/day: 0.25    Types: Cigarettes  . Smokeless tobacco: Never Used  . Alcohol use No  . Drug use:  No  . Sexual activity: Yes    Birth control/ protection: None   Other Topics Concern  . Not on file   Social History Narrative  . No narrative on file   No current facility-administered medications on file prior to encounter.    Current Outpatient Prescriptions on File Prior to Encounter  Medication Sig Dispense Refill  . albuterol (PROVENTIL HFA;VENTOLIN HFA) 108 (90 BASE) MCG/ACT inhaler Inhale 2 puffs into the lungs every 6 (six) hours as needed for shortness of breath. 1 Inhaler 1  . cyclobenzaprine (FLEXERIL) 10 MG tablet Take 1 tablet (10 mg total) by mouth 2 (two) times daily as needed for muscle spasms. 20 tablet 0  . docusate sodium (COLACE) 100 MG capsule Take 1 capsule (100 mg total) by mouth 2 (two) times daily. 30 capsule 0  . HYDROcodone-acetaminophen (NORCO/VICODIN) 5-325 MG tablet Take 1 tablet by mouth every 4 (four) hours as needed. 10 tablet 0  . ibuprofen (ADVIL,MOTRIN) 600 MG tablet Take 1 tablet (600 mg total) by mouth every 6 (six) hours. 30 tablet 0  . Prenatal Vit-Fe Fumarate-FA (PRENATAL VITAMINS PLUS) 27-1 MG TABS Take 1 tablet by mouth daily. 30 tablet 6   No Known Allergies  ROS:  Review  of Systems  Constitutional: Negative for chills, fatigue and fever.  Respiratory: Negative for shortness of breath.   Cardiovascular: Negative for chest pain.  Gastrointestinal: Negative for nausea and vomiting.  Genitourinary: Negative for difficulty urinating, dysuria, flank pain, pelvic pain, vaginal bleeding, vaginal discharge and vaginal pain.  Musculoskeletal: Positive for back pain, myalgias and neck pain.  Neurological: Negative for dizziness and headaches.  Psychiatric/Behavioral: Negative.      I have reviewed patient's Past Medical Hx, Surgical Hx, Family Hx, Social Hx, medications and allergies.   Physical Exam   Patient Vitals for the past 24 hrs:  BP Temp Pulse Resp Height Weight  04/03/16 1513 109/61 - 107 18 - -  04/03/16 1341 - - - - 5\' 7"   (1.702 m) 130 lb (59 kg)  04/03/16 1310 127/74 98.2 F (36.8 C) 101 18 - -   Constitutional: Well-developed, well-nourished female in no acute distress.  Cardiovascular: normal rate Respiratory: normal effort GI: Abd soft, non-tender. Pos BS x 4 MS: Extremities nontender, no edema, normal ROM Neurologic: Alert and oriented x 4.  GU: Neg CVAT. Skin: Lesion on on anterior outer portion of lower leg/ankle.  Small dark brown depression in center and surrounding 3 cm of tissue raised, erythemetous, warm to touch, and hard, not fluctuant.  No exudate on today's exam.  PELVIC EXAM: Deferred  Fundal height 1 cm below umbilicus c/w 19 weeks  FHT 150 by doppler  LAB RESULTS Results for orders placed or performed during the hospital encounter of 04/03/16 (from the past 24 hour(s))  Urinalysis, Routine w reflex microscopic (not at [facility])     Status: None   Collection Time: 04/03/16  1:10 PM  Result Value Ref Range   Color, Urine YELLOW YELLOW   APPearance CLEAR CLEAR   Specific Gravity, Urine 1.020 1.005 - 1.030   pH 6.5 5.0 - 8.0   Glucose, UA NEGATIVE NEGATIVE mg/dL   Hgb urine dipstick NEGATIVE NEGATIVE   Bilirubin Urine NEGATIVE NEGATIVE   Ketones, ur NEGATIVE NEGATIVE mg/dL   Protein, ur NEGATIVE NEGATIVE mg/dL   Nitrite NEGATIVE NEGATIVE   Leukocytes, UA NEGATIVE NEGATIVE       IMAGING No results found.  MAU Management/MDM: Ordered labs and reviewed results. NST was obtained since pt unsure of LMP or EDD but estimate is 20 weeks based on fundal height so NST discontinued.  Upper back pain likely from recent changes in activity/moving since release from jail.  Flexeril and heating pad used in MAU with some relief so Rx for Flexeril 5-10 mg TID PRN sent to pharmacy.  Dr Doristine CounterShenk consulted and to bedside to evaluate spider bite.  Rx for Keflex 500 mg BID x 7 days, discontinue amoxicillin if still taking.  Outpatient US ordered for anatomy scan.  Pt to go to clinic after MAU visit today  to schedule prenatal visits.  Return to MAU or Urgent Care if spider bite worsens.  Return to MAU with gyn or pregnancy symptoms.  Pt stable at time of discharge.  ASSESSMENT 1. Late prenatal care affecting pregnancy in second trimester   2. Spider bite wound, accidental or unintentional, initial encounter   3. Back pain affecting pregnancy in second trimester     PLAN Discharge home   Medication List    STOP taking these medications   HYDROcodone-acetaminophen 5-325 MG tablet Commonly known as:  NORCO/VICODIN   ibuprofen 600 MG tablet Commonly known as:  ADVIL,MOTRIN     TAKE these medications   albuterol 108 (  90 Base) MCG/ACT inhaler Commonly known as:  PROVENTIL HFA;VENTOLIN HFA Inhale 2 puffs into the lungs every 6 (six) hours as needed for shortness of breath.   cephALEXin 500 MG capsule Commonly known as:  KEFLEX Take 1 capsule (500 mg total) by mouth 2 (two) times daily.   cyclobenzaprine 10 MG tablet Commonly known as:  FLEXERIL Take 0.5-1 tablets (5-10 mg total) by mouth 3 (three) times daily as needed for muscle spasms. What changed:  how much to take  when to take this   docusate sodium 100 MG capsule Commonly known as:  COLACE Take 1 capsule (100 mg total) by mouth 2 (two) times daily.   PRENATAL VITAMINS PLUS 27-1 MG Tabs Take 1 tablet by mouth daily.      Follow-up Information    Center for [facility]. Schedule an appointment as soon as possible for a visit today.   Specialty:  Obstetrics and Gynecology Why:  Return to MAU as needed for emergencies Contact information: [address] [place] [address] [place] [address] [phone]          Sharen Counter Certified Nurse-Midwife 04/03/2016  3:15 PM

## 2016-04-21 ENCOUNTER — Other Ambulatory Visit (HOSPITAL_COMMUNITY): Payer: Self-pay | Admitting: Advanced Practice Midwife

## 2016-04-21 ENCOUNTER — Ambulatory Visit (HOSPITAL_COMMUNITY)
Admission: RE | Admit: 2016-04-21 | Discharge: 2016-04-21 | Disposition: A | Discharge status: Home / Self Care | Payer: Medicaid Other | Source: Ambulatory Visit | Attending: Advanced Practice Midwife | Admitting: Advanced Practice Midwife

## 2016-04-21 DIAGNOSIS — O99332 Smoking (tobacco) complicating pregnancy, second trimester: Secondary | ICD-10-CM | POA: Diagnosis not present

## 2016-04-21 DIAGNOSIS — O0932 Supervision of pregnancy with insufficient antenatal care, second trimester: Secondary | ICD-10-CM | POA: Insufficient documentation

## 2016-04-21 DIAGNOSIS — O34219 Maternal care for unspecified type scar from previous cesarean delivery: Secondary | ICD-10-CM | POA: Diagnosis not present

## 2016-04-21 DIAGNOSIS — Z363 Encounter for antenatal screening for malformations: Secondary | ICD-10-CM | POA: Diagnosis not present

## 2016-04-21 DIAGNOSIS — Z3A24 24 weeks gestation of pregnancy: Secondary | ICD-10-CM

## 2016-05-01 ENCOUNTER — Ambulatory Visit (INDEPENDENT_AMBULATORY_CARE_PROVIDER_SITE_OTHER): Payer: Medicaid Other | Admitting: Obstetrics and Gynecology

## 2016-05-01 ENCOUNTER — Encounter: Payer: Self-pay | Admitting: Family Medicine

## 2016-05-01 ENCOUNTER — Encounter: Payer: Self-pay | Admitting: Obstetrics and Gynecology

## 2016-05-01 VITALS — BP 124/73 | HR 88 | Wt 145.0 lb

## 2016-05-01 DIAGNOSIS — Z3492 Encounter for supervision of normal pregnancy, unspecified, second trimester: Secondary | ICD-10-CM | POA: Insufficient documentation

## 2016-05-01 DIAGNOSIS — O34219 Maternal care for unspecified type scar from previous cesarean delivery: Secondary | ICD-10-CM

## 2016-05-01 DIAGNOSIS — Z3482 Encounter for supervision of other normal pregnancy, second trimester: Secondary | ICD-10-CM

## 2016-05-01 DIAGNOSIS — Z124 Encounter for screening for malignant neoplasm of cervix: Secondary | ICD-10-CM | POA: Diagnosis present

## 2016-05-01 DIAGNOSIS — Z113 Encounter for screening for infections with a predominantly sexual mode of transmission: Secondary | ICD-10-CM

## 2016-05-01 MED ORDER — PREPLUS 27-1 MG PO TABS: 1.0000 | ORAL_TABLET | Freq: Every day | ORAL | 13 refills | Status: AC

## 2016-05-01 NOTE — Patient Instructions (Signed)

## 2016-05-01 NOTE — Addendum Note (Signed)
Addended by: Sherre LainASH, Qualyn Oyervides A on: 05/01/2016 02:13 PM   Modules accepted: Orders

## 2016-05-01 NOTE — Progress Notes (Signed)
Subjective:  Latasha Mitchell is a 20 y.o. [phone] [id number] at 9453w4d being seen today for ongoing prenatal care.  She is currently monitored for the following issues for this low-risk pregnancy and has Previous cesarean delivery affecting pregnancy, antepartum; Marijuana use; and Supervision of normal pregnancy in second trimester on her problem list.  Patient reports no complaints.  Contractions: Not present. Vag. Bleeding: None.  Movement: Present. Denies leaking of fluid.   The following portions of the patient's history were reviewed and updated as appropriate: allergies, current medications, past family history, past medical history, past social history, past surgical history and problem list. Problem list updated.  Objective:   Vitals:   05/01/16 1259  BP: 124/73  Pulse: 88  Weight: 145 lb (65.8 kg)    Fetal Status: Fetal Heart Rate (bpm): 146   Movement: Present     General:  Alert, oriented and cooperative. Patient is in no acute distress.  Skin: Skin is warm and dry. No rash noted.   Cardiovascular: Normal heart rate noted  Respiratory: Normal respiratory effort, no problems with respiration noted  Abdomen: Soft, gravid, appropriate for gestational age. Pain/Pressure: Absent     Pelvic:  closed        Extremities: Normal range of motion.  Edema: None  Mental Status: Normal mood and affect. Normal behavior. Normal judgment and thought content.  Breast sym supple no nipple d/c masses or adenopathy Urinalysis:      Assessment and Plan:  Pregnancy: [id number] at 5153w4d  1. Encounter for supervision of other normal pregnancy in second trimester Flu vaccine today - US MFM OB FOLLOW UP; Future - Prenatal Profile - Culture, OB Urine - Pain Mgmt, Profile 6 Conf w/o mM, U - Cytology - PAP  2. Previous cesarean delivery affecting pregnancy, antepartum Successful VBAC in 2016  Needs to sign consent at next visit  Preterm labor symptoms and general obstetric precautions including but not  limited to vaginal bleeding, contractions, leaking of fluid and fetal movement were reviewed in detail with the patient. Please refer to After Visit Summary for other counseling recommendations.  Return in about 3 weeks (around 05/22/2016).   Hermina StaggersMichael L Ameia Morency, MD

## 2016-05-02 LAB — PRENATAL PROFILE (SOLSTAS)
Antibody Screen: NEGATIVE
BASOS ABS: 0 {cells}/uL (ref 0–200)
Basophils Relative: 0 %
Eosinophils Absolute: 91 cells/uL (ref 15–500)
Eosinophils Relative: 1 %
HCT: 34.5 % — ABNORMAL LOW (ref 35.0–45.0)
HEP B S AG: NEGATIVE
HIV 1&2 Ab, 4th Generation: NONREACTIVE
Hemoglobin: 11.8 g/dL (ref 11.7–15.5)
LYMPHS ABS: 1820 {cells}/uL (ref 850–3900)
LYMPHS PCT: 20 %
MCH: 30.4 pg (ref 27.0–33.0)
MCHC: 34.2 g/dL (ref 32.0–36.0)
MCV: 88.9 fL (ref 80.0–100.0)
MPV: 9.6 fL (ref 7.5–12.5)
Monocytes Absolute: 455 cells/uL (ref 200–950)
Monocytes Relative: 5 %
NEUTROS PCT: 74 %
Neutro Abs: 6734 cells/uL (ref 1500–7800)
Platelets: 233 10*3/uL (ref 140–400)
RBC: 3.88 MIL/uL (ref 3.80–5.10)
RDW: 14.1 % (ref 11.0–15.0)
RH TYPE: POSITIVE
RUBELLA: 1.4 {index} — AB (ref <0.90)
WBC: 9.1 10*3/uL (ref 3.8–10.8)

## 2016-05-02 LAB — CYTOLOGY - PAP
Chlamydia: NEGATIVE
Diagnosis: NEGATIVE
NEISSERIA GONORRHEA: NEGATIVE
Trichomonas: NEGATIVE

## 2016-05-13 ENCOUNTER — Inpatient Hospital Stay (HOSPITAL_COMMUNITY)
Admission: AD | Admit: 2016-05-13 | Discharge: 2016-05-13 | Disposition: A | Discharge status: Home / Self Care | Payer: Medicaid Other | Source: Ambulatory Visit | Attending: Obstetrics & Gynecology | Admitting: Obstetrics & Gynecology

## 2016-05-13 ENCOUNTER — Encounter (HOSPITAL_COMMUNITY): Payer: Self-pay

## 2016-05-13 DIAGNOSIS — M549 Dorsalgia, unspecified: Secondary | ICD-10-CM | POA: Diagnosis present

## 2016-05-13 DIAGNOSIS — O26892 Other specified pregnancy related conditions, second trimester: Secondary | ICD-10-CM | POA: Diagnosis not present

## 2016-05-13 DIAGNOSIS — O99332 Smoking (tobacco) complicating pregnancy, second trimester: Secondary | ICD-10-CM | POA: Diagnosis not present

## 2016-05-13 DIAGNOSIS — F17210 Nicotine dependence, cigarettes, uncomplicated: Secondary | ICD-10-CM | POA: Diagnosis not present

## 2016-05-13 DIAGNOSIS — Z3482 Encounter for supervision of other normal pregnancy, second trimester: Secondary | ICD-10-CM

## 2016-05-13 DIAGNOSIS — Z3A27 27 weeks gestation of pregnancy: Secondary | ICD-10-CM | POA: Diagnosis not present

## 2016-05-13 DIAGNOSIS — Z3A26 26 weeks gestation of pregnancy: Secondary | ICD-10-CM

## 2016-05-13 DIAGNOSIS — S29012A Strain of muscle and tendon of back wall of thorax, initial encounter: Secondary | ICD-10-CM | POA: Diagnosis not present

## 2016-05-13 DIAGNOSIS — M545 Low back pain: Secondary | ICD-10-CM

## 2016-05-13 DIAGNOSIS — O9989 Other specified diseases and conditions complicating pregnancy, childbirth and the puerperium: Secondary | ICD-10-CM

## 2016-05-13 MED ORDER — CYCLOBENZAPRINE HCL 10 MG PO TABS: 10.0000 mg | ORAL_TABLET | Freq: Three times a day (TID) | ORAL | 0 refills | Status: DC | PRN

## 2016-05-13 MED ORDER — CYCLOBENZAPRINE HCL 10 MG PO TABS
10.0000 mg | ORAL_TABLET | Freq: Once | ORAL | Status: AC
  Administered 2016-05-13: 10 mg via ORAL
  Filled 2016-05-13: qty 1

## 2016-05-13 NOTE — Discharge Instructions (Signed)
Muscle Strain A muscle strain (pulled muscle) happens when a muscle is stretched beyond normal length. It happens when a sudden, violent force stretches your muscle too far. Usually, a few of the fibers in your muscle are torn. Muscle strain is common in athletes. Recovery usually takes 1-2 weeks. Complete healing takes 5-6 weeks. Follow these instructions at home:  Follow the PRICE method of treatment to help your injury get better. Do this the first 2-3 days after the injury: ? Protect. Protect the muscle to keep it from getting injured again. ? Rest. Limit your activity and rest the injured body part. ? Ice. Put ice in a plastic bag. Place a towel between your skin and the bag. Then, apply the ice and leave it on from 15-20 minutes each hour. After the third day, switch to moist heat packs. ? Compression. Use a splint or elastic bandage on the injured area for comfort. Do not put it on too tightly. ? Elevate. Keep the injured body part above the level of your heart.  Only take medicine as told by your doctor.  Warm up before doing exercise to prevent future muscle strains. Contact a doctor if:  You have more pain or puffiness (swelling) in the injured area.  You feel numbness, tingling, or notice a loss of strength in the injured area. This information is not intended to replace advice given to you by your health care provider. Make sure you discuss any questions you have with your health care provider. Document Released: 03/18/2008 Document Revised: 11/15/2015 Document Reviewed: 01/06/2013 Elsevier Interactive Patient Education  2017 Elsevier Inc.  

## 2016-05-13 NOTE — MAU Note (Signed)
Pt thinks she may have pulled a muscle in her back, pain is in mid back, hurts worse with deep breath or exhaling, also hurts into her chest & neck.  Denies abd pain, bleeding or LOF.

## 2016-05-13 NOTE — MAU Provider Note (Signed)
History     CSN: [id number]  Arrival date and time: 05/13/16 1239   First Provider Initiated Contact with Patient 05/13/16 1404      Chief Complaint  Patient presents with  . Back Pain   HPI [name] is a 20 y.o. [phone] [id number] at 7560w2d who presents to MAU today with complaint of upper back pain. The patient states that she often has issues with muscle pain in pregnancy. She has 2 young children and lifts them regularly. She states pain started yesterday. She took Tylenol last night with minimal relief. She has tried heat, ice and menthol patches with some relief. She rates pain now at 7/10. She denies contractions, vaginal bleeding, LOF or complications of the pregnancy. She reports good fetal movement.  OB History    Gravida Para Term Preterm AB Living   5 2 2  0 2 2   SAB TAB Ectopic Multiple Live Births   2 0 0 0 2      Past Medical History:  Diagnosis Date  . Bronchitis   . Former smoker   . History of marijuana use   . Kidney infection   . Late prenatal care    at 24 wks  . UTI (urinary tract infection)     Past Surgical History:  Procedure Laterality Date  . CESAREAN SECTION    . DILATION AND CURETTAGE OF UTERUS      Family History  Problem Relation Age of Onset  . Heart disease Maternal Grandfather   . Diabetes Paternal Grandmother   . Hypertension Paternal Grandmother   . Stroke Paternal Grandmother   . Heart disease Paternal Grandfather     Social History  Substance Use Topics  . Smoking status: Current Every Day Smoker    Packs/day: 0.25    Types: Cigarettes  . Smokeless tobacco: Never Used  . Alcohol use No    Allergies: No Known Allergies  Prescriptions Prior to Admission  Medication Sig Dispense Refill Last Dose  . albuterol (PROVENTIL HFA;VENTOLIN HFA) 108 (90 BASE) MCG/ACT inhaler Inhale 2 puffs into the lungs every 6 (six) hours as needed for shortness of breath. 1 Inhaler 1 Past Month at Unknown time  . docusate sodium (COLACE)  100 MG capsule Take 1 capsule (100 mg total) by mouth 2 (two) times daily. 30 capsule 0   . Prenatal Vit-Fe Fumarate-FA (PREPLUS) 27-1 MG TABS Take 1 tablet by mouth daily. 30 tablet 13     Review of Systems  Constitutional: Negative for fever and malaise/fatigue.  Respiratory: Negative for shortness of breath.   Cardiovascular: Negative for chest pain.  Gastrointestinal: Negative for abdominal pain.  Genitourinary:       Neg - vaginal bleeding, discharge, LOF  Musculoskeletal: Positive for back pain.   Physical Exam   Blood pressure (!) 98/47, pulse 86, temperature 98.1 F (36.7 C), temperature source Oral, resp. rate 18, last menstrual period 01/02/2016, unknown if currently breastfeeding.  Physical Exam  Nursing note and vitals reviewed. Constitutional: She is oriented to person, place, and time. She appears well-developed and well-nourished. No distress.  HENT:  Head: Normocephalic and atraumatic.  Cardiovascular: Normal rate.   Respiratory: Effort normal and breath sounds normal. No respiratory distress. She has no wheezes. She has no rales. She exhibits no tenderness.  GI: Soft. She exhibits no distension and no mass. There is no tenderness. There is no rebound and no guarding.  Musculoskeletal:       Arms: Neurological: She is alert and  oriented to person, place, and time.  Skin: Skin is warm and dry. No erythema.  Psychiatric: She has a normal mood and affect.   Fetal Monitoring: Baseline: 120 bpm Variability: moderate Accelerations: 10 x 10 Decelerations: none Contractions: none  MAU Course  Procedures  MDM Flexeril given in MAU.   Assessment and Plan  A: SIUP at 2949w2d Muscle strain, upper back, right  P: Discharge home Rx for Flexeril given  Advised to avoid heavy lifting Continue heat/ice for pain Preterm labor precautions discussed Patient advised to follow-up with [facility] as scheduled for routine prenatal care Patient may return to MAU as needed or  if her condition were to change or worsen   Marny LowensteinJulie N Wenzel, PA-C  05/13/2016, 2:04 PM

## 2016-05-22 ENCOUNTER — Ambulatory Visit (INDEPENDENT_AMBULATORY_CARE_PROVIDER_SITE_OTHER): Payer: Self-pay | Admitting: Obstetrics and Gynecology

## 2016-05-22 VITALS — BP 113/64 | HR 84 | Wt 150.0 lb

## 2016-05-22 DIAGNOSIS — Z23 Encounter for immunization: Secondary | ICD-10-CM

## 2016-05-22 DIAGNOSIS — O34219 Maternal care for unspecified type scar from previous cesarean delivery: Secondary | ICD-10-CM

## 2016-05-22 DIAGNOSIS — Z3482 Encounter for supervision of other normal pregnancy, second trimester: Secondary | ICD-10-CM

## 2016-05-22 LAB — CBC
HCT: 35.6 % (ref 35.0–45.0)
Hemoglobin: 12 g/dL (ref 11.7–15.5)
MCH: 29.9 pg (ref 27.0–33.0)
MCHC: 33.7 g/dL (ref 32.0–36.0)
MCV: 88.6 fL (ref 80.0–100.0)
MPV: 9.7 fL (ref 7.5–12.5)
PLATELETS: 229 10*3/uL (ref 140–400)
RBC: 4.02 MIL/uL (ref 3.80–5.10)
RDW: 13.7 % (ref 11.0–15.0)
WBC: 9.2 10*3/uL (ref 3.8–10.8)

## 2016-05-22 NOTE — Patient Instructions (Signed)
AREA PEDIATRIC/FAMILY PRACTICE PHYSICIANS  [facility] [address], [address] [place], [place]  [address] Phone - [phone]   Fax - [phone]  [facility] [address] [place], [place] [address] Phone - [phone]   Fax - [phone]  [facility] [address] [place], [place]  [address] Phone - [phone]   Fax - [phone]  [facility] [address], [address] [place], [place]  [address] Phone - [phone]   Fax - [phone]  [facility] [address] [place], [place]  [address] Phone - [phone]   Fax - [phone]  [facility] [address], [address] [place], [place]  [address] Phone - [phone]   Fax - [phone]  [facility] [address], [address] [place], [place]  [address] Phone - [phone]   Fax - [phone]  [facility] [address] [place], [place]  [address] Phone - [phone]   Fax - [phone]  [facility] [address] [place], [place]  [address] Phone - [phone]   Fax - [phone] [facility] [place] [address] [place], [place]  [address] Phone - [phone]   Fax - [phone]  [facility] [address] [place], [place]  [address] Phone - [phone]   Fax - [phone]  [facility] [address], [address] [place], [place]  [address] Phone - [phone]   Fax - [phone]  [facility] [address], [address] [place], [place]  [address] Phone - [phone]   Fax - [phone]  [facility] [address], [address] [place], [place]  [address] Phone - [phone]  [facility] [address] [place], [place]  [address] Phone - [phone]   Fax - [phone]  [facility] [address], [address] [place], [place]  [address] Phone - [phone]   Fax - [phone]  [facility] [address] [place], [place]  [address] Phone - [phone]   Fax - [phone]  [facility] [address] [place], [place]  [address] Phone - [phone]   Fax - [phone]   [facility] [address], [address] [place], [place]  [address] Phone - [phone]   Fax - [phone]  [facility] [address], [address] [place], [place]  [address] Phone - [phone]   Fax - [phone]  [facility] [address], [address] [place], [place]  [address] Phone - [phone]   Fax - [phone]  [facility] [address], [address] [place], [place]  [address] Phone - [phone]   Fax - [phone]  [facility] [address] [place] , [place]  [address] Phone - [phone]   Fax - [phone] [facility] [place] [address] [place], [place]  [address] Phone - [phone]   Fax - [phone]  [facility] [address] [place], [place]  [address] Phone - [phone]   Fax - [phone]  [facility] [place] [address] [place], [place]  [address] Phone - [phone]   Fax - [phone]    Intrauterine Device Information Introduction An intrauterine device (IUD) is a medical device that gets inserted into the uterus to prevent pregnancy. It is a small, T-shaped device that has one or two  nylon strings hanging down from it. The strings hang out of the lower part of the uterus (cervix) to allow for future IUD removal. There are two types of IUDs available:  Copper IUD. This type of IUD has copper wire wrapped around it. A copper IUD may last up to 10 years.  Hormone IUD. This type of IUD is made of plastic and contains the hormone progestin (synthetic progesterone). A hormone IUD may last 3 to 5 years. IUDs  are inserted through the vagina and placed into the uterus with a minor medical procedure. How does the IUD work? Copper in the copper IUD prevents pregnancy by making the uterus and fallopian tubes produce a fluid that kills sperm. Synthetic progesterone in hormonal IUD prevents pregnancy by:  Thickening cervical mucus to prevent sperm from entering the uterus.  Thinning the uterine lining to prevent implantation of a fertilized egg.  Weakening or killing sperm that get into the uterus. What are the advantages of an IUD?  IUDs are highly effective, reversible, long-acting, and low-maintenance.  There are no estrogen-related side effects.  An IUD can be used when breastfeeding.  IUDs are not associated with weight gain.  Advantages of the copper IUD are that:  It works immediately after insertion.  It does not interfere with your body's natural hormones.  It can be used for 10 years.  Advantages of the hormone IUD are that:  If it is inserted within 7 days of your period starting, it works immediately after insertion. If the hormone IUD is inserted at any other time in your cycle, you will need to use a backup method of birth control for 7 days after insertion.  It can make menstrual periods lighter.  It can decrease menstrual cramping.  It can be used for 3 or 5 years. What are the disadvantages of an IUD?  The hormone IUD may cause irregular menstrual bleeding for a period of time after insertion.  The copper IUD can make your menstrual flow heavier and more painful.  You may experience some pain during insertion, and cramping and vaginal bleeding after insertion. How is the IUD removed? Is the IUD right for me?  Your health care provider will make sure you are a good candidate for an IUD and will discuss side effects with you. This information is not intended to replace advice given to you by your health care provider. Make sure you discuss any questions you have with  your health care provider. Document Released: 05/13/2004 Document Revised: 11/15/2015 Document Reviewed: 11/28/2012  2017 Elsevier

## 2016-05-22 NOTE — Progress Notes (Signed)
Subjective:  Latasha Mitchell is a 20 y.o. [phone] at 6426w4d being seen today for ongoing prenatal care.  She is currently monitored for the following issues for this low-risk pregnancy and has Previous cesarean delivery affecting pregnancy, antepartum; Marijuana use; and Supervision of normal pregnancy in second trimester on her problem list.  Patient reports no complaints.  Contractions: Not present. Vag. Bleeding: None.  Movement: Present. Denies leaking of fluid.   The following portions of the patient's history were reviewed and updated as appropriate: allergies, current medications, past family history, past medical history, past social history, past surgical history and problem list. Problem list updated.  Objective:   Vitals:   05/22/16 0804  BP: 113/64  Pulse: 84  Weight: 150 lb (68 kg)    Fetal Status: Fetal Heart Rate (bpm): 144 Fundal Height: 29 cm Movement: Present     General:  Alert, oriented and cooperative. Patient is in no acute distress.  Skin: Skin is warm and dry. No rash noted.   Cardiovascular: Normal heart rate noted  Respiratory: Normal respiratory effort, no problems with respiration noted  Abdomen: Soft, gravid, appropriate for gestational age. Pain/Pressure: Absent     Pelvic:  Cervical exam deferred        Extremities: Normal range of motion.  Edema: None  Mental Status: Normal mood and affect. Normal behavior. Normal judgment and thought content.   Urinalysis:      Assessment and Plan:  Pregnancy: [id number] at 8026w4d  1. Encounter for supervision of other normal pregnancy in second trimester  - CBC - RPR - HIV antibody (with reflex) - 2Hr GTT w/ 1 Hr Carpenter 75 g - Tdap vaccine greater than or equal to 7yo IM  2. Previous cesarean delivery affecting pregnancy, antepartum Undecided now about RLTCS vs TOLAC Will need to discuss again at next OB visit - CBC - RPR - HIV antibody (with reflex) - 2Hr GTT w/ 1 Hr Carpenter 75 g - Tdap vaccine  greater than or equal to 7yo IM  Preterm labor symptoms and general obstetric precautions including but not limited to vaginal bleeding, contractions, leaking of fluid and fetal movement were reviewed in detail with the patient. Please refer to After Visit Summary for other counseling recommendations.  Return in about 2 weeks (around 06/05/2016) for OB visit.   Hermina StaggersMichael L Latanja Lehenbauer, MD

## 2016-05-22 NOTE — Progress Notes (Signed)
Patient doing 2 hr gtt today

## 2016-05-23 LAB — HIV ANTIBODY (ROUTINE TESTING W REFLEX): HIV 1&2 Ab, 4th Generation: NONREACTIVE

## 2016-05-23 LAB — 2HR GTT W 1 HR, CARPENTER, 75 G
GLUCOSE, 1 HR, GEST: 105 mg/dL (ref <180)
GLUCOSE, FASTING, GEST: 84 mg/dL (ref 65–91)
Glucose, 2 Hr, Gest: 63 mg/dL — ABNORMAL LOW (ref <153)

## 2016-05-23 LAB — RPR

## 2016-05-26 ENCOUNTER — Ambulatory Visit (HOSPITAL_COMMUNITY)
Admission: RE | Admit: 2016-05-26 | Discharge: 2016-05-26 | Disposition: A | Discharge status: Home / Self Care | Payer: Medicaid Other | Source: Ambulatory Visit | Attending: Obstetrics and Gynecology | Admitting: Obstetrics and Gynecology

## 2016-05-26 DIAGNOSIS — Z3A29 29 weeks gestation of pregnancy: Secondary | ICD-10-CM | POA: Insufficient documentation

## 2016-05-26 DIAGNOSIS — O34211 Maternal care for low transverse scar from previous cesarean delivery: Secondary | ICD-10-CM | POA: Insufficient documentation

## 2016-05-26 DIAGNOSIS — O99332 Smoking (tobacco) complicating pregnancy, second trimester: Secondary | ICD-10-CM | POA: Diagnosis not present

## 2016-05-26 DIAGNOSIS — O0932 Supervision of pregnancy with insufficient antenatal care, second trimester: Secondary | ICD-10-CM | POA: Insufficient documentation

## 2016-05-26 DIAGNOSIS — Z3482 Encounter for supervision of other normal pregnancy, second trimester: Secondary | ICD-10-CM

## 2016-06-05 ENCOUNTER — Ambulatory Visit (INDEPENDENT_AMBULATORY_CARE_PROVIDER_SITE_OTHER): Payer: Medicaid Other | Admitting: Family

## 2016-06-05 VITALS — BP 102/57 | HR 69

## 2016-06-05 DIAGNOSIS — O34219 Maternal care for unspecified type scar from previous cesarean delivery: Secondary | ICD-10-CM

## 2016-06-05 DIAGNOSIS — Z3482 Encounter for supervision of other normal pregnancy, second trimester: Secondary | ICD-10-CM

## 2016-06-05 NOTE — Progress Notes (Signed)
   PRENATAL VISIT NOTE  Subjective:  Latasha Mitchell is a 20 y.o. [phone] at 3370w4d being seen today for ongoing prenatal care.  She is currently monitored for the following issues for this low-risk pregnancy and has Previous cesarean delivery affecting pregnancy, antepartum; Marijuana use; and Supervision of normal pregnancy in second trimester on her problem list.  Patient reports occasional contractions.  Contractions: Not present.  .  Movement: Present. Denies leaking of fluid.   The following portions of the patient's history were reviewed and updated as appropriate: allergies, current medications, past family history, past medical history, past social history, past surgical history and problem list. Problem list updated.  Objective:   Vitals:   06/05/16 1028  BP: (!) 102/57  Pulse: 69    Fetal Status: Fetal Heart Rate (bpm): 152 Fundal Height: 30 cm Movement: Present     General:  Alert, oriented and cooperative. Patient is in no acute distress.  Skin: Skin is warm and dry. No rash noted.   Cardiovascular: Normal heart rate noted  Respiratory: Normal respiratory effort, no problems with respiration noted  Abdomen: Soft, gravid, appropriate for gestational age. Pain/Pressure: Present     Pelvic:  Cervical exam deferred        Extremities: Normal range of motion.  Edema: None  Mental Status: Normal mood and affect. Normal behavior. Normal judgment and thought content.   Assessment and Plan:  Pregnancy: [id number] at 5070w4d  1. Encounter for supervision of other normal pregnancy in third trimester - Reviewed third trimester labs  2. Previous cesarean delivery affecting pregnancy, antepartum - Desires TOLAC after further discussion  Preterm labor symptoms and general obstetric precautions including but not limited to vaginal bleeding, contractions, leaking of fluid and fetal movement were reviewed in detail with the patient. Please refer to After Visit Summary for other  counseling recommendations.  Return in about 2 weeks (around 06/19/2016).   Eino FarberWalidah Kennith GainN Karim, CNM

## 2016-06-18 ENCOUNTER — Encounter: Payer: Medicaid Other | Admitting: Advanced Practice Midwife

## 2016-06-20 ENCOUNTER — Ambulatory Visit (INDEPENDENT_AMBULATORY_CARE_PROVIDER_SITE_OTHER): Payer: Medicaid Other | Admitting: Advanced Practice Midwife

## 2016-06-20 VITALS — BP 100/58 | HR 83 | Wt 158.1 lb

## 2016-06-20 DIAGNOSIS — Z3482 Encounter for supervision of other normal pregnancy, second trimester: Secondary | ICD-10-CM

## 2016-06-20 DIAGNOSIS — O34219 Maternal care for unspecified type scar from previous cesarean delivery: Secondary | ICD-10-CM

## 2016-06-20 DIAGNOSIS — O479 False labor, unspecified: Secondary | ICD-10-CM

## 2016-06-20 NOTE — Patient Instructions (Signed)
Braxton Hicks Contractions °Contractions of the uterus can occur throughout pregnancy. Contractions are not always a sign that you are in labor.  °WHAT ARE BRAXTON HICKS CONTRACTIONS?  °Contractions that occur before labor are called Braxton Hicks contractions, or false labor. Toward the end of pregnancy (32-34 weeks), these contractions can develop more often and may become more forceful. This is not true labor because these contractions do not result in opening (dilatation) and thinning of the cervix. They are sometimes difficult to tell apart from true labor because these contractions can be forceful and people have different pain tolerances. You should not feel embarrassed if you go to the hospital with false labor. Sometimes, the only way to tell if you are in true labor is for your health care provider to look for changes in the cervix. °If there are no prenatal problems or other health problems associated with the pregnancy, it is completely safe to be sent home with false labor and await the onset of true labor. °HOW CAN YOU TELL THE DIFFERENCE BETWEEN TRUE AND FALSE LABOR? °False Labor  °· The contractions of false labor are usually shorter and not as hard as those of true labor.   °· The contractions are usually irregular.   °· The contractions are often felt in the front of the lower abdomen and in the groin.   °· The contractions may go away when you walk around or change positions while lying down.   °· The contractions get weaker and are shorter lasting as time goes on.   °· The contractions do not usually become progressively stronger, regular, and closer together as with true labor.   °True Labor  °· Contractions in true labor last 30-70 seconds, become very regular, usually become more intense, and increase in frequency.   °· The contractions do not go away with walking.   °· The discomfort is usually felt in the top of the uterus and spreads to the lower abdomen and low back.   °· True labor can be  determined by your health care provider with an exam. This will show that the cervix is dilating and getting thinner.   °WHAT TO REMEMBER °· Keep up with your usual exercises and follow other instructions given by your health care provider.   °· Take medicines as directed by your health care provider.   °· Keep your regular prenatal appointments.   °· Eat and drink lightly if you think you are going into labor.   °· If Braxton Hicks contractions are making you uncomfortable:   °¨ Change your position from lying down or resting to walking, or from walking to resting.   °¨ Sit and rest in a tub of warm water.   °¨ Drink 2-3 glasses of water. Dehydration may cause these contractions.   °¨ Do slow and deep breathing several times an hour.   °WHEN SHOULD I SEEK IMMEDIATE MEDICAL CARE? °Seek immediate medical care if: °· Your contractions become stronger, more regular, and closer together.   °· You have fluid leaking or gushing from your vagina.   °· You have a fever.   °· You pass blood-tinged mucus.   °· You have vaginal bleeding.   °· You have continuous abdominal pain.   °· You have low back pain that you never had before.   °· You feel your baby's head pushing down and causing pelvic pressure.   °· Your baby is not moving as much as it used to.   °This information is not intended to replace advice given to you by your health care provider. Make sure you discuss any questions you have with your health care   provider. °Document Released: 06/09/2005 Document Revised: 10/01/2015 Document Reviewed: 03/21/2013 °Elsevier Interactive Patient Education © 2017 Elsevier Inc. ° °

## 2016-06-20 NOTE — Progress Notes (Signed)
   PRENATAL VISIT NOTE  Subjective:  Latasha Mitchell is a 20 y.o. [phone] at 5363w5d being seen today for ongoing prenatal care.  She is currently monitored for the following issues for this low-risk pregnancy and has Previous cesarean delivery affecting pregnancy, antepartum; Marijuana use; and Supervision of normal pregnancy in second trimester on her problem list.  Patient reports no complaints.  Contractions: Irritability. Vag. Bleeding: None.  Movement: Present. Denies leaking of fluid.   The following portions of the patient's history were reviewed and updated as appropriate: allergies, current medications, past family history, past medical history, past social history, past surgical history and problem list. Problem list updated.  Objective:   Vitals:   06/20/16 0924  BP: (!) 100/58  Pulse: 83  Weight: 158 lb 1.6 oz (71.7 kg)    Fetal Status: Fetal Heart Rate (bpm): 155 Fundal Height: 32 cm Movement: Present  Presentation: Vertex  General:  Alert, oriented and cooperative. Patient is in no acute distress.  Skin: Skin is warm and dry. No rash noted.   Cardiovascular: Normal heart rate noted  Respiratory: Normal respiratory effort, no problems with respiration noted  Abdomen: Soft, gravid, appropriate for gestational age. Pain/Pressure: Present     Pelvic:  Cervical exam deferred        Extremities: Normal range of motion.  Edema: None  Mental Status: Normal mood and affect. Normal behavior. Normal judgment and thought content.   Assessment and Plan:  Pregnancy: [id number] at 5863w5d  1. Braxton Hick's contraction  2. Previous C/S  - TOLAC consent signed   Preterm labor symptoms and general obstetric precautions including but not limited to vaginal bleeding, contractions, leaking of fluid and fetal movement were reviewed in detail with the patient. Please refer to After Visit Summary for other counseling recommendations.  Return in about 2 weeks (around 07/04/2016) for  ROB.   Dorathy KinsmanVirginia Buford Bremer, CNM

## 2016-06-23 NOTE — L&D Delivery Note (Signed)
20 y.o. [id number] at 2750w4d delivered a viable female infant in cephalic, LOA position. x1 nuchal cord, easily reduced. Anterior shoulder delivered with ease. 60 sec delayed cord clamping. Cord clamped x2 and cut. Placenta delivered spontaneously intact, with 3VC. Fundus firm on exam with massage and pitocin. Good hemostasis noted.  Laceration: minor left perineal Suture: none Good hemostasis noted. EBL:100cc  Mom and baby recovering in LDR.    Apgars: 8/9 Weight: pending    Renne Muscaaniel L Warden, MD PGY-1 07/24/2016, 9:22 AM  OB FELLOW DELIVERY ATTESTATION  I was gloved and present for the delivery in its entirety, and I agree with the above resident's note.    Ernestina PennaNicholas Duston Smolenski, MD 10:03 AM

## 2016-07-08 ENCOUNTER — Ambulatory Visit (INDEPENDENT_AMBULATORY_CARE_PROVIDER_SITE_OTHER): Payer: Medicaid Other | Admitting: Advanced Practice Midwife

## 2016-07-08 DIAGNOSIS — Z3482 Encounter for supervision of other normal pregnancy, second trimester: Secondary | ICD-10-CM

## 2016-07-08 MED ORDER — PROMETHAZINE HCL 25 MG PO TABS: 25.0000 mg | ORAL_TABLET | Freq: Four times a day (QID) | ORAL | 2 refills | Status: DC | PRN

## 2016-07-08 NOTE — Patient Instructions (Signed)
Braxton Hicks Contractions °Contractions of the uterus can occur throughout pregnancy. Contractions are not always a sign that you are in labor.  °WHAT ARE BRAXTON HICKS CONTRACTIONS?  °Contractions that occur before labor are called Braxton Hicks contractions, or false labor. Toward the end of pregnancy (32-34 weeks), these contractions can develop more often and may become more forceful. This is not true labor because these contractions do not result in opening (dilatation) and thinning of the cervix. They are sometimes difficult to tell apart from true labor because these contractions can be forceful and people have different pain tolerances. You should not feel embarrassed if you go to the hospital with false labor. Sometimes, the only way to tell if you are in true labor is for your health care provider to look for changes in the cervix. °If there are no prenatal problems or other health problems associated with the pregnancy, it is completely safe to be sent home with false labor and await the onset of true labor. °HOW CAN YOU TELL THE DIFFERENCE BETWEEN TRUE AND FALSE LABOR? °False Labor  °· The contractions of false labor are usually shorter and not as hard as those of true labor.   °· The contractions are usually irregular.   °· The contractions are often felt in the front of the lower abdomen and in the groin.   °· The contractions may go away when you walk around or change positions while lying down.   °· The contractions get weaker and are shorter lasting as time goes on.   °· The contractions do not usually become progressively stronger, regular, and closer together as with true labor.   °True Labor  °· Contractions in true labor last 30-70 seconds, become very regular, usually become more intense, and increase in frequency.   °· The contractions do not go away with walking.   °· The discomfort is usually felt in the top of the uterus and spreads to the lower abdomen and low back.   °· True labor can be  determined by your health care provider with an exam. This will show that the cervix is dilating and getting thinner.   °WHAT TO REMEMBER °· Keep up with your usual exercises and follow other instructions given by your health care provider.   °· Take medicines as directed by your health care provider.   °· Keep your regular prenatal appointments.   °· Eat and drink lightly if you think you are going into labor.   °· If Braxton Hicks contractions are making you uncomfortable:   °¨ Change your position from lying down or resting to walking, or from walking to resting.   °¨ Sit and rest in a tub of warm water.   °¨ Drink 2-3 glasses of water. Dehydration may cause these contractions.   °¨ Do slow and deep breathing several times an hour.   °WHEN SHOULD I SEEK IMMEDIATE MEDICAL CARE? °Seek immediate medical care if: °· Your contractions become stronger, more regular, and closer together.   °· You have fluid leaking or gushing from your vagina.   °· You have a fever.   °· You pass blood-tinged mucus.   °· You have vaginal bleeding.   °· You have continuous abdominal pain.   °· You have low back pain that you never had before.   °· You feel your baby's head pushing down and causing pelvic pressure.   °· Your baby is not moving as much as it used to.   °This information is not intended to replace advice given to you by your health care provider. Make sure you discuss any questions you have with your health care   provider. °Document Released: 06/09/2005 Document Revised: 10/01/2015 Document Reviewed: 03/21/2013 °Elsevier Interactive Patient Education © 2017 Elsevier Inc. ° °

## 2016-07-08 NOTE — Progress Notes (Signed)
   PRENATAL VISIT NOTE  Subjective:  Latasha Mitchell is a 21 y.o. [phone] at 7070w2d being seen today for ongoing prenatal care.  She is currently monitored for the following issues for this low-risk pregnancy and has Previous cesarean delivery affecting pregnancy, antepartum; Marijuana use; and Supervision of normal pregnancy in second trimester on her problem list.  Patient reports occasional contractions.  Contractions: Irregular. Vag. Bleeding: None.  Movement: Present. Denies leaking of fluid.   The following portions of the patient's history were reviewed and updated as appropriate: allergies, current medications, past family history, past medical history, past social history, past surgical history and problem list. Problem list updated.  Objective:   Vitals:   07/08/16 1240  BP: 116/66  Pulse: 67  Weight: 154 lb 8 oz (70.1 kg)    Fetal Status: Fetal Heart Rate (bpm): 157 Fundal Height: 35 cm Movement: Present  Presentation: Vertex  General:  Alert, oriented and cooperative. Patient is in no acute distress.  Skin: Skin is warm and dry. No rash noted.   Cardiovascular: Normal heart rate noted  Respiratory: Normal respiratory effort, no problems with respiration noted  Abdomen: Soft, gravid, appropriate for gestational age. Pain/Pressure: Present     Pelvic:  Cervical exam deferred        Extremities: Normal range of motion.  Edema: None  Mental Status: Normal mood and affect. Normal behavior. Normal judgment and thought content.   Assessment and Plan:  Pregnancy: [id number] at 5770w2d  1. Encounter for supervision of other normal pregnancy in second trimester   Preterm labor symptoms and general obstetric precautions including but not limited to vaginal bleeding, contractions, leaking of fluid and fetal movement were reviewed in detail with the patient. Please refer to After Visit Summary for other counseling recommendations.  Return in about 1 week (around 07/15/2016) for ROB.    GBS at [place].    Dorathy KinsmanVirginia Jahmya Onofrio, CNM

## 2016-07-16 ENCOUNTER — Other Ambulatory Visit (HOSPITAL_COMMUNITY)
Admission: RE | Admit: 2016-07-16 | Discharge: 2016-07-16 | Disposition: A | Discharge status: Home / Self Care | Payer: Medicaid Other | Source: Ambulatory Visit | Attending: Obstetrics and Gynecology | Admitting: Obstetrics and Gynecology

## 2016-07-16 ENCOUNTER — Ambulatory Visit (INDEPENDENT_AMBULATORY_CARE_PROVIDER_SITE_OTHER): Payer: Medicaid Other | Admitting: Obstetrics and Gynecology

## 2016-07-16 VITALS — BP 122/69 | HR 77 | Wt 156.5 lb

## 2016-07-16 DIAGNOSIS — Z3490 Encounter for supervision of normal pregnancy, unspecified, unspecified trimester: Secondary | ICD-10-CM

## 2016-07-16 DIAGNOSIS — Z113 Encounter for screening for infections with a predominantly sexual mode of transmission: Secondary | ICD-10-CM | POA: Diagnosis not present

## 2016-07-16 DIAGNOSIS — O2243 Hemorrhoids in pregnancy, third trimester: Secondary | ICD-10-CM | POA: Diagnosis not present

## 2016-07-16 DIAGNOSIS — Z3493 Encounter for supervision of normal pregnancy, unspecified, third trimester: Secondary | ICD-10-CM

## 2016-07-16 DIAGNOSIS — O34219 Maternal care for unspecified type scar from previous cesarean delivery: Secondary | ICD-10-CM | POA: Diagnosis not present

## 2016-07-16 DIAGNOSIS — K649 Unspecified hemorrhoids: Secondary | ICD-10-CM | POA: Insufficient documentation

## 2016-07-16 DIAGNOSIS — K640 First degree hemorrhoids: Secondary | ICD-10-CM | POA: Diagnosis not present

## 2016-07-16 LAB — OB RESULTS CONSOLE GBS: GBS: NEGATIVE

## 2016-07-16 MED ORDER — HYDROCORTISONE 2.5 % RE CREA: 1.0000 "application " | TOPICAL_CREAM | Freq: Two times a day (BID) | RECTAL | 0 refills | Status: DC

## 2016-07-16 NOTE — Progress Notes (Signed)
Subjective:  Latasha Mitchell is a 21 y.o. [phone] at 2739w3d being seen today for ongoing prenatal care.  She is currently monitored for the following issues for this low-risk pregnancy and has Previous cesarean delivery affecting pregnancy, antepartum; Marijuana use; and Supervision of normal pregnancy in second trimester on her problem list.  Patient reports no complaints.  Contractions: Irritability. Vag. Bleeding: None.  Movement: Present. Denies leaking of fluid.   The following portions of the patient's history were reviewed and updated as appropriate: allergies, current medications, past family history, past medical history, past social history, past surgical history and problem list. Problem list updated.  Objective:   Vitals:   07/16/16 1355  BP: 122/69  Pulse: 77  Weight: 156 lb 8 oz (71 kg)    Fetal Status: Fetal Heart Rate (bpm): 138   Movement: Present     General:  Alert, oriented and cooperative. Patient is in no acute distress.  Skin: Skin is warm and dry. No rash noted.   Cardiovascular: Normal heart rate noted  Respiratory: Normal respiratory effort, no problems with respiration noted  Abdomen: Soft, gravid, appropriate for gestational age. Pain/Pressure: Present     Pelvic:  Cervical exam performed        Extremities: Normal range of motion.  Edema: None  Mental Status: Normal mood and affect. Normal behavior. Normal judgment and thought content.   Urinalysis:      Assessment and Plan:  Pregnancy: [id number] at 5239w3d  1. Encounter for supervision of low-risk pregnancy, antepartum  - Culture, beta strep (group b only) - GC/Chlamydia probe amp ([place]  2. Previous cesarean delivery affecting pregnancy, antepartum TOLAC consent signed   Preterm labor symptoms and general obstetric precautions including but not limited to vaginal bleeding, contractions, leaking of fluid and fetal movement were reviewed in detail with the patient. Please refer to  After Visit Summary for other counseling recommendations.  Return in about 1 week (around 07/23/2016) for OB visit.   Hermina StaggersMichael L Ervin, MD

## 2016-07-16 NOTE — Progress Notes (Signed)
rou

## 2016-07-16 NOTE — Patient Instructions (Signed)

## 2016-07-17 LAB — GC/CHLAMYDIA PROBE AMP (~~LOC~~) NOT AT ARMC
CHLAMYDIA, DNA PROBE: NEGATIVE
NEISSERIA GONORRHEA: NEGATIVE

## 2016-07-18 LAB — CULTURE, BETA STREP (GROUP B ONLY)

## 2016-07-21 ENCOUNTER — Ambulatory Visit (INDEPENDENT_AMBULATORY_CARE_PROVIDER_SITE_OTHER): Payer: Medicaid Other | Admitting: Obstetrics and Gynecology

## 2016-07-21 VITALS — BP 107/51 | HR 86 | Wt 158.5 lb

## 2016-07-21 DIAGNOSIS — O34219 Maternal care for unspecified type scar from previous cesarean delivery: Secondary | ICD-10-CM

## 2016-07-21 DIAGNOSIS — Z3482 Encounter for supervision of other normal pregnancy, second trimester: Secondary | ICD-10-CM

## 2016-07-21 NOTE — Progress Notes (Signed)
Subjective:  Latasha Mitchell is a 21 y.o. [phone] at 4234w1d being seen today for ongoing prenatal care.  She is currently monitored for the following issues for this low-risk pregnancy and has Previous cesarean delivery affecting pregnancy, antepartum; Marijuana use; Supervision of normal pregnancy in second trimester; and Hemorrhoids on her problem list.  Patient reports fatigue.  Contractions: Not present.  .  Movement: Present. Denies leaking of fluid.   The following portions of the patient's history were reviewed and updated as appropriate: allergies, current medications, past family history, past medical history, past social history, past surgical history and problem list. Problem list updated.  Objective:   Vitals:   07/21/16 1305  BP: (!) 107/51  Pulse: 86  Weight: 158 lb 8 oz (71.9 kg)    Fetal Status: Fetal Heart Rate (bpm): 140   Movement: Present     General:  Alert, oriented and cooperative. Patient is in no acute distress.  Skin: Skin is warm and dry. No rash noted.   Cardiovascular: Normal heart rate noted  Respiratory: Normal respiratory effort, no problems with respiration noted  Abdomen: Soft, gravid, appropriate for gestational age. Pain/Pressure: Present     Pelvic:  Cervical exam deferred        Extremities: Normal range of motion.     Mental Status: Normal mood and affect. Normal behavior. Normal judgment and thought content.   Urinalysis:      Assessment and Plan:  Pregnancy: [id number] at 4834w1d  1. Previous cesarean delivery affecting pregnancy, antepartum TOLAC  2. Encounter for supervision of other normal pregnancy in second trimester Labor precautions  Term labor symptoms and general obstetric precautions including but not limited to vaginal bleeding, contractions, leaking of fluid and fetal movement were reviewed in detail with the patient. Please refer to After Visit Summary for other counseling recommendations.  Return in about 1 week (around  07/28/2016) for OB visit.   Hermina StaggersMichael L Riel Hirschman, MD

## 2016-07-21 NOTE — Patient Instructions (Signed)

## 2016-07-24 ENCOUNTER — Inpatient Hospital Stay (HOSPITAL_COMMUNITY): Payer: Medicaid Other | Admitting: Anesthesiology

## 2016-07-24 ENCOUNTER — Inpatient Hospital Stay (HOSPITAL_COMMUNITY)
Admission: AD | Admit: 2016-07-24 | Discharge: 2016-07-26 | DRG: 775 | Disposition: A | Discharge status: Home / Self Care | Payer: Medicaid Other | Source: Ambulatory Visit | Attending: Obstetrics and Gynecology | Admitting: Obstetrics and Gynecology

## 2016-07-24 ENCOUNTER — Encounter (HOSPITAL_COMMUNITY): Payer: Self-pay

## 2016-07-24 DIAGNOSIS — F1290 Cannabis use, unspecified, uncomplicated: Secondary | ICD-10-CM | POA: Diagnosis present

## 2016-07-24 DIAGNOSIS — O4202 Full-term premature rupture of membranes, onset of labor within 24 hours of rupture: Principal | ICD-10-CM | POA: Diagnosis present

## 2016-07-24 DIAGNOSIS — Z3A37 37 weeks gestation of pregnancy: Secondary | ICD-10-CM

## 2016-07-24 DIAGNOSIS — Z833 Family history of diabetes mellitus: Secondary | ICD-10-CM | POA: Diagnosis not present

## 2016-07-24 DIAGNOSIS — O34211 Maternal care for low transverse scar from previous cesarean delivery: Secondary | ICD-10-CM | POA: Diagnosis present

## 2016-07-24 DIAGNOSIS — F17210 Nicotine dependence, cigarettes, uncomplicated: Secondary | ICD-10-CM | POA: Diagnosis present

## 2016-07-24 DIAGNOSIS — Z3490 Encounter for supervision of normal pregnancy, unspecified, unspecified trimester: Secondary | ICD-10-CM

## 2016-07-24 DIAGNOSIS — Z823 Family history of stroke: Secondary | ICD-10-CM

## 2016-07-24 DIAGNOSIS — O99324 Drug use complicating childbirth: Secondary | ICD-10-CM | POA: Diagnosis present

## 2016-07-24 DIAGNOSIS — O2243 Hemorrhoids in pregnancy, third trimester: Secondary | ICD-10-CM | POA: Diagnosis present

## 2016-07-24 DIAGNOSIS — O99334 Smoking (tobacco) complicating childbirth: Secondary | ICD-10-CM | POA: Diagnosis present

## 2016-07-24 DIAGNOSIS — Z8249 Family history of ischemic heart disease and other diseases of the circulatory system: Secondary | ICD-10-CM

## 2016-07-24 LAB — POCT FERN TEST: POCT Fern Test: POSITIVE

## 2016-07-24 LAB — RPR: RPR: NONREACTIVE

## 2016-07-24 LAB — CBC
HEMATOCRIT: 32.5 % — AB (ref 36.0–46.0)
Hemoglobin: 11.5 g/dL — ABNORMAL LOW (ref 12.0–15.0)
MCH: 30.2 pg (ref 26.0–34.0)
MCHC: 35.4 g/dL (ref 30.0–36.0)
MCV: 85.3 fL (ref 78.0–100.0)
Platelets: 185 10*3/uL (ref 150–400)
RBC: 3.81 MIL/uL — AB (ref 3.87–5.11)
RDW: 13.6 % (ref 11.5–15.5)
WBC: 11.4 10*3/uL — AB (ref 4.0–10.5)

## 2016-07-24 LAB — TYPE AND SCREEN
ABO/RH(D): O POS
Antibody Screen: NEGATIVE

## 2016-07-24 MED ORDER — FENTANYL CITRATE (PF) 100 MCG/2ML IJ SOLN
100.0000 ug | INTRAMUSCULAR | Status: DC | PRN
  Administered 2016-07-24 (×3): 100 ug via INTRAVENOUS
  Filled 2016-07-24 (×3): qty 2

## 2016-07-24 MED ORDER — TETANUS-DIPHTH-ACELL PERTUSSIS 5-2.5-18.5 LF-MCG/0.5 IM SUSP: 0.5000 mL | Freq: Once | INTRAMUSCULAR | Status: DC

## 2016-07-24 MED ORDER — DIPHENHYDRAMINE HCL 50 MG/ML IJ SOLN: 12.5000 mg | INTRAMUSCULAR | Status: DC | PRN

## 2016-07-24 MED ORDER — LACTATED RINGERS IV SOLN
500.0000 mL | Freq: Once | INTRAVENOUS | Status: AC
  Administered 2016-07-24: 500 mL via INTRAVENOUS

## 2016-07-24 MED ORDER — SIMETHICONE 80 MG PO CHEW
80.0000 mg | CHEWABLE_TABLET | ORAL | Status: DC | PRN
Start: 2016-07-24 — End: 2016-07-26
  Filled 2016-07-24: qty 1

## 2016-07-24 MED ORDER — PRENATAL MULTIVITAMIN CH
1.0000 | ORAL_TABLET | Freq: Every day | ORAL | Status: DC
  Administered 2016-07-24 – 2016-07-26 (×3): 1 via ORAL
  Filled 2016-07-24 (×4): qty 1

## 2016-07-24 MED ORDER — LIDOCAINE HCL (PF) 1 % IJ SOLN
INTRAMUSCULAR | Status: DC | PRN
  Administered 2016-07-24: 5 mL via EPIDURAL

## 2016-07-24 MED ORDER — PHENYLEPHRINE 40 MCG/ML (10ML) SYRINGE FOR IV PUSH (FOR BLOOD PRESSURE SUPPORT)
80.0000 ug | PREFILLED_SYRINGE | INTRAVENOUS | Status: DC | PRN
  Filled 2016-07-24: qty 5

## 2016-07-24 MED ORDER — DIPHENHYDRAMINE HCL 25 MG PO CAPS: 25.0000 mg | ORAL_CAPSULE | Freq: Four times a day (QID) | ORAL | Status: DC | PRN

## 2016-07-24 MED ORDER — FENTANYL 2.5 MCG/ML BUPIVACAINE 1/10 % EPIDURAL INFUSION (WH - ANES)
14.0000 mL/h | INTRAMUSCULAR | Status: DC | PRN
  Administered 2016-07-24: 14 mL/h via EPIDURAL
  Filled 2016-07-24: qty 100

## 2016-07-24 MED ORDER — ONDANSETRON HCL 4 MG PO TABS: 4.0000 mg | ORAL_TABLET | ORAL | Status: DC | PRN

## 2016-07-24 MED ORDER — FLEET ENEMA 7-19 GM/118ML RE ENEM: 1.0000 | ENEMA | RECTAL | Status: DC | PRN

## 2016-07-24 MED ORDER — ALBUTEROL SULFATE (2.5 MG/3ML) 0.083% IN NEBU: 3.0000 mL | INHALATION_SOLUTION | Freq: Four times a day (QID) | RESPIRATORY_TRACT | Status: DC | PRN

## 2016-07-24 MED ORDER — ZOLPIDEM TARTRATE 5 MG PO TABS: 5.0000 mg | ORAL_TABLET | Freq: Every evening | ORAL | Status: DC | PRN

## 2016-07-24 MED ORDER — DIBUCAINE 1 % RE OINT: 1.0000 "application " | TOPICAL_OINTMENT | RECTAL | Status: DC | PRN

## 2016-07-24 MED ORDER — ONDANSETRON HCL 4 MG/2ML IJ SOLN
4.0000 mg | INTRAMUSCULAR | Status: DC | PRN
Start: 2016-07-24 — End: 2016-07-26

## 2016-07-24 MED ORDER — EPHEDRINE 5 MG/ML INJ
10.0000 mg | INTRAVENOUS | Status: DC | PRN
  Filled 2016-07-24: qty 4

## 2016-07-24 MED ORDER — ACETAMINOPHEN 325 MG PO TABS: 650.0000 mg | ORAL_TABLET | ORAL | Status: DC | PRN

## 2016-07-24 MED ORDER — COCONUT OIL OIL
1.0000 "application " | TOPICAL_OIL | Status: DC | PRN
  Administered 2016-07-25: 1 via TOPICAL
  Filled 2016-07-24: qty 120

## 2016-07-24 MED ORDER — OXYTOCIN BOLUS FROM INFUSION
500.0000 mL | Freq: Once | INTRAVENOUS | Status: AC
  Administered 2016-07-24: 500 mL via INTRAVENOUS

## 2016-07-24 MED ORDER — SENNOSIDES-DOCUSATE SODIUM 8.6-50 MG PO TABS
2.0000 | ORAL_TABLET | ORAL | Status: DC
  Administered 2016-07-25 (×2): 2 via ORAL
  Filled 2016-07-24 (×2): qty 2

## 2016-07-24 MED ORDER — VITAMIN K1 1 MG/0.5ML IJ SOLN
INTRAMUSCULAR | Status: AC
  Filled 2016-07-24: qty 0.5

## 2016-07-24 MED ORDER — WITCH HAZEL-GLYCERIN EX PADS: 1.0000 "application " | MEDICATED_PAD | CUTANEOUS | Status: DC | PRN

## 2016-07-24 MED ORDER — OXYTOCIN 40 UNITS IN LACTATED RINGERS INFUSION - SIMPLE MED
1.0000 m[IU]/min | INTRAVENOUS | Status: DC
  Administered 2016-07-24: 2 m[IU]/min via INTRAVENOUS

## 2016-07-24 MED ORDER — LACTATED RINGERS IV SOLN
INTRAVENOUS | Status: DC
  Administered 2016-07-24 (×2): via INTRAVENOUS

## 2016-07-24 MED ORDER — LACTATED RINGERS IV SOLN: 500.0000 mL | Freq: Once | INTRAVENOUS | Status: DC

## 2016-07-24 MED ORDER — IBUPROFEN 600 MG PO TABS
600.0000 mg | ORAL_TABLET | Freq: Four times a day (QID) | ORAL | Status: DC
  Administered 2016-07-24 – 2016-07-26 (×8): 600 mg via ORAL
  Filled 2016-07-24 (×9): qty 1

## 2016-07-24 MED ORDER — FENTANYL 2.5 MCG/ML BUPIVACAINE 1/10 % EPIDURAL INFUSION (WH - ANES): 14.0000 mL/h | INTRAMUSCULAR | Status: DC | PRN

## 2016-07-24 MED ORDER — OXYTOCIN 40 UNITS IN LACTATED RINGERS INFUSION - SIMPLE MED
2.5000 [IU]/h | INTRAVENOUS | Status: DC
  Filled 2016-07-24: qty 1000

## 2016-07-24 MED ORDER — BENZOCAINE-MENTHOL 20-0.5 % EX AERO
1.0000 "application " | INHALATION_SPRAY | CUTANEOUS | Status: DC | PRN
  Filled 2016-07-24: qty 56

## 2016-07-24 MED ORDER — LIDOCAINE HCL (PF) 1 % IJ SOLN
30.0000 mL | INTRAMUSCULAR | Status: DC | PRN
  Filled 2016-07-24: qty 30

## 2016-07-24 MED ORDER — SOD CITRATE-CITRIC ACID 500-334 MG/5ML PO SOLN: 30.0000 mL | ORAL | Status: DC | PRN

## 2016-07-24 MED ORDER — OXYCODONE-ACETAMINOPHEN 5-325 MG PO TABS
1.0000 | ORAL_TABLET | ORAL | Status: DC | PRN
  Administered 2016-07-25 – 2016-07-26 (×5): 1 via ORAL
  Filled 2016-07-24 (×5): qty 1

## 2016-07-24 MED ORDER — LACTATED RINGERS IV SOLN: 500.0000 mL | INTRAVENOUS | Status: DC | PRN

## 2016-07-24 MED ORDER — ONDANSETRON HCL 4 MG/2ML IJ SOLN: 4.0000 mg | Freq: Four times a day (QID) | INTRAMUSCULAR | Status: DC | PRN

## 2016-07-24 MED ORDER — PHENYLEPHRINE 40 MCG/ML (10ML) SYRINGE FOR IV PUSH (FOR BLOOD PRESSURE SUPPORT)
80.0000 ug | PREFILLED_SYRINGE | INTRAVENOUS | Status: DC | PRN
  Filled 2016-07-24: qty 10
  Filled 2016-07-24: qty 5

## 2016-07-24 MED ORDER — PNEUMOCOCCAL VAC POLYVALENT 25 MCG/0.5ML IJ INJ
0.5000 mL | INJECTION | INTRAMUSCULAR | Status: AC
  Administered 2016-07-25: 0.5 mL via INTRAMUSCULAR
  Filled 2016-07-24: qty 0.5

## 2016-07-24 MED ORDER — TERBUTALINE SULFATE 1 MG/ML IJ SOLN
0.2500 mg | Freq: Once | INTRAMUSCULAR | Status: DC | PRN
  Filled 2016-07-24: qty 1

## 2016-07-24 NOTE — Lactation Note (Signed)
This note was copied from a baby's chart. Lactation Consultation Note  Patient Name: Latasha Mitchell [id number]'BToday's Date: 07/24/2016 Reason for consult: Initial assessment;Infant < 6lbs;Other (Comment) (Early Term Infant)   Report to Devin, RN to start pump today. [place] [facility]    Maternal Data Formula Feeding for Exclusion: Yes Reason for exclusion: Mother's choice to formula and breast feed on admission Has patient been taught Hand Expression?: Yes Does the patient have breastfeeding experience prior to this delivery?: Yes  Feeding Feeding Type: Breast Fed  LATCH Score/Interventions Latch: Too sleepy or reluctant, no latch achieved, no sucking elicited. Intervention(s): Skin to skin;Teach feeding cues;Waking techniques  Audible Swallowing: None  Type of Nipple: Everted at rest and after stimulation  Comfort (Breast/Nipple): Soft / non-tender     Hold (Positioning): No assistance needed to correctly position infant at breast.  LATCH Score: 6  Lactation Tools Discussed/Used WIC Program: Yes   Consult Status Consult Status: Follow-up Date: 07/24/16 Follow-up type: In-patient    Silas FloodSharon S Wynn Kernes 07/24/2016, 11:09 AM

## 2016-07-24 NOTE — H&P (Signed)
LABOR AND DELIVERY ADMISSION HISTORY AND PHYSICAL NOTE  Latasha Mitchell is a 21 y.o. female [phone] with IUP at 1473w4d by 2nd trimester ultrasound presenting with spontaneous rupture of membranes. She reports getting up to use the bathroom this morning around 1:30 and feeling a gush of fluid. She continued to have leakage of clear fluid and decided to come in for further evaluation. She was found to be vertex within the MAU. She also was fern +.    She reports positive fetal movement. She denies vaginal bleeding.  Prenatal History/Complications: C-section secondary to failure to progress with successful VBAC afterwards THC use Tobacco use throughout pregnancy Late prenatal care  Past Medical History: Past Medical History:  Diagnosis Date  . Bronchitis   . Former smoker   . History of marijuana use   . Kidney infection   . Late prenatal care    at 24 wks  . UTI (urinary tract infection)     Past Surgical History: Past Surgical History:  Procedure Laterality Date  . CESAREAN SECTION    . DILATION AND CURETTAGE OF UTERUS      Obstetrical History: OB History    Gravida Para Term Preterm AB Living   5 2 2  0 2 2   SAB TAB Ectopic Multiple Live Births   2 0 0 0 2      Social History: Social History   Social History  . Marital status: Single    Spouse name: N/A  . Number of children: N/A  . Years of education: N/A   Social History Main Topics  . Smoking status: Current Every Day Smoker    Packs/day: 0.25    Types: Cigarettes  . Smokeless tobacco: Never Used  . Alcohol use No  . Drug use: No  . Sexual activity: Yes    Birth control/ protection: None   Other Topics Concern  . None   Social History Narrative  . None    Family History: Family History  Problem Relation Age of Onset  . Heart disease Maternal Grandfather   . Diabetes Paternal Grandmother   . Hypertension Paternal Grandmother   . Stroke Paternal Grandmother   . Heart disease Paternal  Grandfather     Allergies: No Known Allergies  Prescriptions Prior to Admission  Medication Sig Dispense Refill Last Dose  . albuterol (PROVENTIL HFA;VENTOLIN HFA) 108 (90 BASE) MCG/ACT inhaler Inhale 2 puffs into the lungs every 6 (six) hours as needed for shortness of breath. 1 Inhaler 1 Taking  . Prenatal Vit-Fe Fumarate-FA (PREPLUS) 27-1 MG TABS Take 1 tablet by mouth daily. (Patient not taking: Reported on 07/21/2016) 30 tablet 13 Not Taking     Review of Systems   All systems reviewed and negative except as stated in HPI  Blood pressure (!) 117/54, pulse 68, temperature 98.3 F (36.8 C), temperature source Oral, resp. rate 18, height 5\' 7"  (1.702 m), weight 158 lb (71.7 kg), last menstrual period 01/02/2016, unknown if currently breastfeeding. General appearance: alert, cooperative and no distress Lungs: clear to auscultation bilaterally Heart: regular rate and rhythm Abdomen: soft, non-tender; bowel sounds normal Extremities: No calf swelling or tenderness Presentation: cephalic Fetal monitoring: FHT with baseline in the 130s, +accels, - decels, moderate variability Uterine activity: contractions q5-6 min  Exam by:: Ivonne AndrewV Smith CNM   Prenatal labs: ABO, Rh: O/POS/-- (11/09 1345) Antibody: NEG (11/09 1345) Rubella: Immune RPR: NON REAC (11/30 0837)  HBsAg: NEGATIVE (11/09 1345)  HIV: NONREACTIVE (11/30 0837)  GBS: Negative (01/24  0000)  1 hr Glucola: normal in 3rd trimester Genetic screening:  Too late to [facility] Anatomy US: Normal at 29 weeks  Prenatal Transfer Tool  Maternal Diabetes: No Genetic Screening: Declined Maternal Ultrasounds/Referrals: Normal Fetal Ultrasounds or other Referrals:  None Maternal Substance Abuse:  Yes:  Type: Smoker Significant Maternal Medications:  None Significant Maternal Lab Results: Lab values include: Group B Strep negative  Results for orders placed or performed during the hospital encounter of 07/24/16 (from the past 24 hour(s))   Fern Test   Collection Time: 07/24/16  2:22 AM  Result Value Ref Range   POCT Fern Test Positive = ruptured amniotic membanes   CBC   Collection Time: 07/24/16  2:39 AM  Result Value Ref Range   WBC 11.4 (H) 4.0 - 10.5 K/uL   RBC 3.81 (L) 3.87 - 5.11 MIL/uL   Hemoglobin 11.5 (L) 12.0 - 15.0 g/dL   HCT 11.9 (L) 14.7 - 82.9 %   MCV 85.3 78.0 - 100.0 fL   MCH 30.2 26.0 - 34.0 pg   MCHC 35.4 30.0 - 36.0 g/dL   RDW 56.2 13.0 - 86.5 %   Platelets 185 150 - 400 K/uL    Patient Active Problem List   Diagnosis Date Noted  . Pregnant and not yet delivered 07/24/2016  . Hemorrhoids 07/16/2016  . Supervision of normal pregnancy in second trimester 05/01/2016  . Marijuana use 10/24/2014  . Previous cesarean delivery affecting pregnancy, antepartum 10/18/2014    Assessment: Latasha Mitchell is a 21 y.o. [id number] at [redacted]w[redacted]d here for spontaneous rupture of membranes.   #Labor:Admitted with plan for expectant management for the time being. Will consider augmentation if patient does not progress.  #Pain: Considering an epidural when appropriate, but would like to hold off as long as possible #FWB: Cat I #ID:  GBS neg- no abx indicated #MOF: Breast and bottle #MOC:Considering IUD #Circ:  N/A  Lise Auer, MD PGY-2 07/24/2016, 3:28 AM   CNM attestation:  I have seen and examined this patient; I agree with above documentation in the resident's note.   Latasha Mitchell is a 21 y.o. [phone] here for PROM  PE: BP 115/62 (BP Location: Right Arm)   Pulse (!) 55   Temp 98 F (36.7 C) (Oral)   Resp 17   Ht 5\' 7"  (1.702 m)   Wt 71.7 kg (158 lb)   LMP 01/02/2016   BMI 24.75 kg/m  Gen: calm comfortable, NAD Resp: normal effort, no distress Abd: gravid  ROS, labs, PMH reviewed  Plan: Admit to [facility] TOLAC Expectant management initially, then Pit prn Anticipate SVD  Roniya Tetro CNM 07/25/2016, 2:43 PM

## 2016-07-24 NOTE — Lactation Note (Signed)
This note was copied from a baby's chart. Lactation Consultation Note  Patient Name: Latasha Mitchell YQMVH'QToday's Date: 07/24/2016 Reason for consult: Follow-up assessment Baby at 10 hr of life. Upon entry mom was spoon feeding baby. She has been manually expressing and spoon feeding because baby has been too sleep to latch since birth. She reports she "tried" pumping a "few minutes ago". She had 10 ml at the bedside. She denies breast or nipple pain, voiced no concerns. She is aware of lactation services. She will offer the breast on demand q3hr, post express, and supplement with her milk per volume guidelines. Talked to RN about possibly syringe feeding tonight if baby is too sleepy to spoon feed. Mom has long finger nails so RN or Dad might have to be the ones to do the feeding.    Maternal Data    Feeding    LATCH Score/Interventions                      Lactation Tools Discussed/Used Pump Review: Setup, frequency, and cleaning;Milk Storage;Other (comment) (pump settings)   Consult Status Consult Status: Follow-up Date: 07/25/16 Follow-up type: In-patient    Latasha Mitchell 07/24/2016, 7:27 PM

## 2016-07-24 NOTE — Anesthesia Pain Management Evaluation Note (Signed)
  CRNA Pain Management Visit Note  Patient: Latasha BoardsAlanna M Maille, 21 y.o., female  "Hello [facility] am a [facility] the [facility]. We have an anesthesia team available at all times to provide care throughout the hospital, including epidural management and anesthesia for C-section. I don't know your plan for the delivery whether it a natural birth, water birth, IV sedation, nitrous supplementation, doula or epidural, but we want to meet your pain goals."   1.Was your pain managed to your expectations on prior hospitalizations?   Yes   2.What is your expectation for pain management during this hospitalization?     Epidural  3.How can we help you reach that goal? Epidural pending  Record the patient's initial score and the patient's pain goal.   Pain: 8  Pain Goal: 8 [facility] wants you to be able to say your pain was always managed very well.  Edison PaceWILKERSON,Nanie Dunkleberger 07/24/2016

## 2016-07-24 NOTE — Anesthesia Preprocedure Evaluation (Signed)
Anesthesia Evaluation  Patient identified by MRN, date of birth, ID band Patient awake    Reviewed: Allergy & Precautions, H&P , NPO status , Patient's Chart, lab work & pertinent test results  Airway Mallampati: II   Neck ROM: full    Dental   Pulmonary Current Smoker,    breath sounds clear to auscultation       Cardiovascular negative cardio ROS   Rhythm:regular Rate:Normal     Neuro/Psych    GI/Hepatic   Endo/Other    Renal/GU      Musculoskeletal   Abdominal   Peds  Hematology   Anesthesia Other Findings   Reproductive/Obstetrics (+) Pregnancy                             Anesthesia Physical Anesthesia Plan  ASA: I  Anesthesia Plan: Epidural   Post-op Pain Management:    Induction: Intravenous  Airway Management Planned: Natural Airway  Additional Equipment:   Intra-op Plan:   Post-operative Plan:   Informed Consent: I have reviewed the patients History and Physical, chart, labs and discussed the procedure including the risks, benefits and alternatives for the proposed anesthesia with the patient or authorized representative who has indicated his/her understanding and acceptance.     Plan Discussed with: CRNA, Anesthesiologist and Surgeon  Anesthesia Plan Comments:         Anesthesia Quick Evaluation

## 2016-07-24 NOTE — Progress Notes (Signed)
UR chart review completed.  

## 2016-07-24 NOTE — MAU Note (Signed)
Pt states that she went to the bathroom and had a gush of fluid around 0130-clear. Continues to leak. Having some irregular, mild contractions. Denies vag bleeding. +FM. Cervix has not been checked.

## 2016-07-24 NOTE — Progress Notes (Signed)
Evaluated patient for progression of labor. Contractions significantly decreased on the monitor. Cervical exam revealing 1/70/-3. Given lack of progression and uterine contractions will initiate pitocin.   FHT with baseline in the 100s-110s, +accels, - decels, mod variability. Contractions not regular, sporadic on the monitor.

## 2016-07-24 NOTE — Anesthesia Postprocedure Evaluation (Signed)
Anesthesia Post Note  Patient: Bartholomew Boardslanna M Mckibbin  Procedure(s) Performed: * No procedures listed *  Patient location during evaluation: Mother Baby Anesthesia Type: Epidural Level of consciousness: awake and alert and oriented Pain management: satisfactory to patient Vital Signs Assessment: post-procedure vital signs reviewed and stable Respiratory status: spontaneous breathing and nonlabored ventilation Cardiovascular status: stable Postop Assessment: no headache, no backache, no signs of nausea or vomiting, adequate PO intake and patient able to bend at knees (patient up walking) Anesthetic complications: no        Last Vitals:  Vitals:   07/24/16 1030 07/24/16 1115  BP: (!) 98/52 (!) 114/53  Pulse: 61 (!) 48  Resp: 18 18  Temp:  36.7 C    Last Pain:  Vitals:   07/24/16 1552  TempSrc:   PainSc: 4    Pain Goal: Patients Stated Pain Goal: 2 (07/24/16 [id number])               Madison HickmanGREGORY,Steffen Hase

## 2016-07-24 NOTE — Anesthesia Procedure Notes (Signed)
Epidural Patient location during procedure: OB Start time: 07/24/2016 8:05 AM End time: 07/24/2016 8:11 AM  Staffing Anesthesiologist: Chaney MallingHODIERNE, Lequan Dobratz Performed: anesthesiologist   Preanesthetic Checklist Completed: patient identified, site marked, pre-op evaluation, timeout performed, IV checked, risks and benefits discussed and monitors and equipment checked  Epidural Patient position: sitting Prep: DuraPrep Patient monitoring: heart rate, cardiac monitor, continuous pulse ox and blood pressure Approach: midline Location: L2-L3 Injection technique: LOR saline  Needle:  Needle type: Tuohy  Needle gauge: 17 G Needle length: 9 cm Needle insertion depth: 6 cm Catheter size: 19 Gauge Catheter at skin depth: 11 cm Test dose: negative and Other  Assessment Events: blood not aspirated, injection not painful, no injection resistance and negative IV test  Additional Notes Informed consent obtained prior to proceeding including risk of failure, 1% risk of PDPH, risk of minor discomfort and bruising.  Discussed rare but serious complications including epidural abscess, permanent nerve injury, epidural hematoma.  Discussed alternatives to epidural analgesia and patient desires to proceed.  Timeout performed pre-procedure verifying patient name, procedure, and platelet count.  Patient tolerated procedure well. Reason for block:procedure for pain

## 2016-07-24 NOTE — Lactation Note (Addendum)
This note was copied from a baby's chart. Lactation Consultation Note  Patient Name: Latasha Mitchell Reason for consult: Initial assessment;Infant < 6lbs;Other (Comment) (Early Term Infant)   Initial assessment with Exp BF mom of 1 hour old infant. Infant STS with mom and mom and RN reports infant has BF for 15 minutes. Infant was weighed with weight of 5 lb 8.9 oz.   Mom with compressible breasts and areola with everted nipples. Mom able to hand express 5 cc colostrum. Infant was placed back STS with mom and was not willing to latch to BF. Mom was shown how to spoon feed and infant was fed 3cc colostrum via spoon. She tolerated it well.  LPT infant Handout given and explained to parents. Discussed with mom that due to infant weight, it is recommended that infant BF first and then follow with expressed colostrum via spoon or syringe. Discussed with mom setting up a DEBP later today to assist with expressing colostrum for infant, mom wants to start pumping. Discussed importance of limiting stimulation, keeping infant hat on at all times, feeding infant at least every 3 hours or more often as she cues, and limiting feeding to 30 minutes. Parents voiced understanding. Feeding log given with instructions for use.   BF Resources Handout and LC Brochure given, mom informed of IP/OP Services, BF Support Groups and LC phone #. Enc mom to call out to desk for feeding assistance as needed. [facility] and is aware to call and make appt post d/c. [facility]. Mom informed of WIC pump loaner program to be completed at d/c.        Maternal Data Formula Feeding for Exclusion: Yes Reason for exclusion: Mother's choice to formula and breast feed on admission Has patient been taught Hand Expression?: Yes Does the patient have breastfeeding experience prior to this delivery?: Yes  Feeding Feeding Type: Breast Fed  LATCH  Score/Interventions Latch: Too sleepy or reluctant, no latch achieved, no sucking elicited. Intervention(s): Skin to skin;Teach feeding cues;Waking techniques  Audible Swallowing: None  Type of Nipple: Everted at rest and after stimulation  Comfort (Breast/Nipple): Soft / non-tender     Hold (Positioning): No assistance needed to correctly position infant at breast.  LATCH Score: 6  Lactation Tools Discussed/Used WIC Program: Yes   Consult Status Consult Status: Follow-up Date: 07/24/16 Follow-up type: In-patient    Silas FloodSharon S Gideon Burstein Mitchell, 10:42 AM

## 2016-07-25 ENCOUNTER — Encounter (HOSPITAL_COMMUNITY): Payer: Self-pay | Admitting: Advanced Practice Midwife

## 2016-07-25 NOTE — Progress Notes (Signed)
CLINICAL SOCIAL WORK MATERNAL/CHILD NOTE  Patient Details  Name: Latasha Mitchell MRN: [id number] Date of Birth: [birth year]  Date:  10-26-2016  Clinical Social Worker Initiating Note:  Terri Piedra, [place] Date/ Time Initiated:  07/25/16/1100     Child's Name:  Latasha Mitchell   Legal Guardian:  Other (Comment) (Parents: Alanson Aly and Gerrie Nordmann)   Need for Interpreter:  None   Date of Referral:  2017-04-06     Reason for Referral:  Current Substance Use/Substance Use During Pregnancy , Other (Comment) (Question of custody of other children)   Referral Source:  [facility]   Address:  [address] [place], [place] [address]  Phone number:  [phone]   Household Members:  Minor Children, Relatives (MOB lives with her Grandmother and two other children: Latasha Mitchell/age 24 and Latasha Mitchell/age 66.5)   Natural Supports (not living in the home):  Immediate Family, Friends, Extended Family (MOB reports that she and FOB are not together but that he is supportive.  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   Education:   (MOB aspires to get her GED and go to college)   Museum/gallery curator Resources:  Medicaid   Other Resources:  [facility]   Cultural/Religious Considerations Which May Impact Care: None stated.  MOB's facesheet notes religion as Darrick Meigs, however, MOB states she is currently "questioning religion."  Strengths:  Ability to meet basic needs , Home prepared for child , Pediatrician chosen    Risk Factors/Current Problems:  Substance Use    Cognitive State:  Able to Concentrate , Alert , Linear Thinking , Insightful , Goal Oriented    Mood/Affect:  Interested , Comfortable , Calm     CSW Assessment: CSW met with MOB in her first floor room/115 to offer support and complete assessment due to marijuana use and questions about the custody of her other children.  MOB was very friendly and receptive to CSW's visit.  She was alone in her room and baby was asleep in the bassinet.  She stated this was a good time to talk with her. MOB appeared to be in good spirits and states she feels "great" and that the "staff have been wonderful."  She states this has been the easiest delivery for her.   MOB states this is her third child with FOB John "Timmy" Mitchell.  She is struggling with whose last name she should give the baby, however, she has already completed the birth certificate.  CSW processed her feelings related to this and suggests she call the birth registrars office as soon as possible to see if it can still be changed (if she decides to change it).  MOB reports that she and FOB are not together, but "on talking terms."  She thinks and hopes that they will be "lifelong friends," but notes that it is better if they are not a couple.  She reports that he is a "good guy" and supportive, but that her family does not like him or support her being in a relationship with him.  She states this is a point of contention with her parents and grandmother.   MOB seemed very open about her hx and current situation.  She states she is living with her grandmother, and although her grandmother often tries to "take control" where MOB's children are concerned, MOB states, "I don't know what I would do without her."  MOB reports that she was living with her parents prior to moving in with her grandmother.  She states she had warrants and her father called to turn her in.  She states she "ran" and lived in hotels for a period of time.  She states her children went to live with her grandmother at this time and soon MOB decided that she needed to face her mistakes and take care of her legal business.  She states she  turned herself in and spent 20+ days in jail.  She reports that her bond was $200 but that her father would not pay it, stating she needed to learn this lesson.  She now states she is happy that he made her do this, because she needed to take responsibility.  MOB states she had a failure to appear charge due to an altercation she got into with FOB's mother 2 years ago.  She reports that she feels relieved that all of her charges are now taken care of and she has no current legal issues.  MOB states that during this time, while her children were living with her grandmother, MOB's grandmother went to the Magistrate to file for custody of her children.  She states they went to court and her grandmother dropped it.  MOB states she has never lost custody of her children, but feels that her grandmother continues to threaten that she can take custody at any time if she feels she needs to.  CSW asked about their relationship and MOB states she and her grandmother have communicated about everything and that she feels it is a good place for her and her children to live.  She is thankful for her grandmother's support. CSW had a long discussion with MOB about mental health and provided education regarding PMADs.  CSW recommends follow up with outpatient counselor.  MOB states she feels she would benefit from counseling and admits to using marijuana to relieve stress.  CSW thanked MOB for her honesty and asked if she can identify healthy coping strategies.  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  She understands hospital drug screen policy, baby's positive UDS for [facility] and mandated report to Child Protective Services.  She reports that her 81.22 year old was also positive for THC at birth and that CPS was involved for a period of time.  CSW will monitor CDS result.  MOB denies any other substance use.  MOB reports feelings of hopelessness after her first child was born, but states she feels like  she has more supports now and is in a better living situation.  She seemed appreciative of CSW's concern for her emotional wellbeing and states interest in seeking outpatient counseling services.  CSW provided her with walk-in clinic information to [facility] and the [facility] as well as support groups held at [place] gave MOB a New Mom Checklist from Postpartum Progress and encouraged her to use this self evaluation tool at various times during the postpartum time period.   MOB is goal oriented and wishes to pursue her GED in order to go to college, possibly for nursing.  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   MOB reports that she has everything she needs for baby at home and is aware of SIDS precautions.  She was open and attentive to CSW's review of precautions.   CPS report made to [place] CPS for positive THC.  CSW identifies no barriers to discharge when infant and mother are medically ready and expect that the report will be given 72 hours to initiate contact.  CPS Intake worker has been informed that discharge will most likely be tomorrow.   CSW Plan/Description:  Child Protective Service Report , Information/Referral to Community Resources , Patient/Family Education , No Further Intervention Required/No Barriers to Discharge    Demontez Novack Elizabeth, LCSW 07/25/2016, 12:54 PM  

## 2016-07-25 NOTE — Progress Notes (Signed)
POSTPARTUM PROGRESS NOTE  Post Partum Day #1  Subjective:  Latasha Mitchell is a 21 y.o. [id number] 7139w5d s/p SVD.  No acute events overnight.  Pt denies problems with ambulating, voiding or po intake.  She denies nausea or vomiting.  Pain is well controlled.  She has had flatus. She has had bowel movement.  Lochia Minimal.   Objective: Blood pressure 115/62, pulse (!) 55, temperature 98 F (36.7 C), temperature source Oral, resp. rate 17, height 5\' 7"  (1.702 m), weight 71.7 kg (158 lb), last menstrual period 01/02/2016, unknown if currently breastfeeding.  Physical Exam:  General: alert, cooperative and no distress Lochia:normal flow Chest: CTAB Heart: RRR no m/r/g Abdomen: +BS, soft, nontender,  Uterine Fundus: firm DVT Evaluation: No calf swelling or tenderness Extremities: no edema   Recent Labs  07/24/16 0239  HGB 11.5*  HCT 32.5*    Assessment/Plan:  ASSESSMENT: Latasha Boardslanna M Welle is a 21 y.o. [id number] 5239w5d s/p SVD. Mom is doing well and has no concerns or complaints.  Baby is staying due to elevated bili.  Will keep mom for today.   Plan for discharge tomorrow   LOS: 1 day   Renne Muscaaniel L Warden, MD PGY-1 Center for [facility], [facility]  07/25/2016, 10:07 AM   CNM attestation Post Partum Day #1 I have seen and examined this patient and agree with above documentation in the resident's note.   Latasha Boardslanna M Moga is a 21 y.o. [id number] s/p VBAC.  Pt denies problems with ambulating, voiding or po intake. Pain is well controlled.  Plan for birth control is IUD.  Method of Feeding: both  PE:  BP 115/62 (BP Location: Right Arm)   Pulse (!) 55   Temp 98 F (36.7 C) (Oral)   Resp 17   Ht 5\' 7"  (1.702 m)   Wt 71.7 kg (158 lb)   LMP 01/02/2016   BMI 24.75 kg/m  Fundus firm  Plan for discharge: 07/26/16  Cam HaiSHAW, Aleeha Boline, CNM 4:38 PM  07/25/2016

## 2016-07-25 NOTE — Lactation Note (Signed)
This note was copied from a baby's chart. Lactation Consultation Note  Mother bottle feeding infant formula upon entering. Reviewed the importance of breastfeeding before offering formula to help establish her milk supply and post pumping. Mother states "I got this".  Denies concerns or questions. Suggest she call if she needs assistance.   Patient Name: Latasha Mitchell ZOXWR'UToday's Date: 07/25/2016 Reason for consult: Follow-up assessment   Maternal Data    Feeding Feeding Type: Bottle Fed - Formula  LATCH Score/Interventions                      Lactation Tools Discussed/Used     Consult Status Consult Status: Follow-up Date: 07/26/16 Follow-up type: In-patient    Dahlia ByesBerkelhammer, Joeanthony Seeling [facility] 07/25/2016, 2:19 PM

## 2016-07-26 ENCOUNTER — Encounter (HOSPITAL_COMMUNITY): Payer: Self-pay | Admitting: *Deleted

## 2016-07-26 MED ORDER — IBUPROFEN 600 MG PO TABS: 600.0000 mg | ORAL_TABLET | Freq: Four times a day (QID) | ORAL | 0 refills | Status: DC | PRN

## 2016-07-26 NOTE — Lactation Note (Signed)
This note was copied from a baby's chart. Lactation Consultation Note  Patient Name: Girl Rachele Lamaster JMEQA'S Date: 07/26/2016 Reason for consult: Follow-up assessment;Infant < 6lbs Infant is 72 hours old & seen by [facility] for follow-up assessment. Baby was asleep in crib when [place] entered. Mom reports that she pumped ~9am and got 15 mL. Mom reports she is giving her pumped milk and formula (Neosure) and wants to continue doing this. Mom reports she is pumping q 3-4hrs and that her breasts are starting to feel sore- mom reports she thinks this is due to hand expression. Discussed technique of hand expression (C/ U hold, pushing back into chest wall & then compressing instead of pulling on her breast tissue/ nipple).  Mom reports she has [place] but knows she will want to get formula from them so will continue with the hand pump/ hand expression instead of doing a [facility] loaner pump. Showed mom how to turn DEBP kit into a double manual pump. Discussed engorgement care; mom reports she remembered this happening with her other child & remembers leaking a lot and so would stand in the shower to help with the fullness.  Discussed importance of pumping 8-12x in 24 hrs and to use ice if feeling engorged. Discussed formula with mom and how if the doctor still wanted baby on Neosure, she would need a prescription for [facility] or she'll get regular formula from [facility]. Mom reports no questions at this time. Reminded mom of Lactation number for questions later.  Maternal Data    Feeding Feeding Type: Formula Nipple Type: Slow - flow  LATCH Score/Interventions                      Lactation Tools Discussed/Used WIC Program: Yes   Consult Status Consult Status: PRN Follow-up type: In-patient    Yvonna Alanis 07/26/2016, 10:35 AM

## 2016-07-26 NOTE — Discharge Summary (Signed)
OB Discharge Summary     Patient Name: Latasha Mitchell DOB: 08-Apr-1996 MRN: [id number]  Date of admission: 07/24/2016 Delivering MD: Lorne Skeens   Date of discharge: 07/26/2016  Admitting diagnosis: [redacted]w[redacted]d, [place]  Intrauterine pregnancy: [redacted]w[redacted]d     Secondary diagnosis:  Active Problems:   Pregnant and not yet delivered  Additional problems: prev C/S followed by VBAC     Discharge diagnosis: Term Pregnancy Delivered and VBAC                                                                                                Post partum procedures:none  Augmentation: Pitocin  Complications: None  Hospital course:  Induction of Labor With Vaginal Delivery     21 y.o. yo [id number] at [redacted]w[redacted]d was admitted with PROM on 07/24/2016. She required Pit to initiate labor, and progressed successful VBAC. Patient had an uncomplicated labor course as follows:  Membrane Rupture Time/Date: 1:30 AM ,07/24/2016   Intrapartum Procedures: Episiotomy: None [1]                                         Lacerations:  None [1]  Patient had a delivery of a Viable infant. 07/24/2016  Information for the patient's newborn:  Zannie, Locastro [[id number]]  Delivery Method: VBAC, Spontaneous (Filed from Delivery Summary)    Pateint had an uncomplicated postpartum course.  She is ambulating, tolerating a regular diet, passing flatus, and urinating well. Patient is discharged home in stable condition on 07/26/16.   Physical exam  Vitals:   07/24/16 1645 07/25/16 0601 07/25/16 1810 07/26/16 0500  BP: (!) 112/59 115/62 125/66 (!) 109/58  Pulse: (!) 50 (!) 55 64 (!) 49  Resp: 16 17 18 18   Temp: 98.1 F (36.7 C) 98 F (36.7 C) 98 F (36.7 C) 98.2 F (36.8 C)  TempSrc: Oral Oral Oral Oral  Weight:      Height:       General: alert and cooperative Lochia: appropriate Uterine Fundus: firm Incision: N/A DVT Evaluation: No evidence of DVT seen on physical exam. Labs: Lab Results  Component Value  Date   WBC 11.4 (H) 07/24/2016   HGB 11.5 (L) 07/24/2016   HCT 32.5 (L) 07/24/2016   MCV 85.3 07/24/2016   PLT 185 07/24/2016   CMP Latest Ref Rng & Units 09/21/2014  Glucose 70 - 99 mg/dL 87  BUN 6 - 23 mg/dL 9  Creatinine 2.95 - 2.84 mg/dL 1.32  Sodium 440 - 102 mmol/L 136  Potassium 3.5 - 5.1 mmol/L 3.4(L)  Chloride 96 - 112 mmol/L 108  CO2 19 - 32 mmol/L 20  Calcium 8.4 - 10.5 mg/dL 9.0  Total Protein 6.0 - 8.3 g/dL 7.3  Total Bilirubin 0.3 - 1.2 mg/dL 7.2(Z)  Alkaline Phos 39 - 117 U/L 62  AST 0 - 37 U/L 21  ALT 0 - 35 U/L 11    Discharge instruction: per After Visit Summary and "Baby and Me Booklet".  After visit  meds:  Allergies as of 07/26/2016   No Known Allergies     Medication List    TAKE these medications   albuterol 108 (90 Base) MCG/ACT inhaler Commonly known as:  PROVENTIL HFA;VENTOLIN HFA Inhale 2 puffs into the lungs every 6 (six) hours as needed for shortness of breath.   ibuprofen 600 MG tablet Commonly known as:  ADVIL,MOTRIN Take 1 tablet (600 mg total) by mouth every 6 (six) hours as needed.   PREPLUS 27-1 MG Tabs Take 1 tablet by mouth daily.       Diet: routine diet  Activity: Advance as tolerated. Pelvic rest for 6 weeks.   Outpatient follow up:6 weeks Follow up Appt:Future Appointments Date Time Provider Department Center  08/25/2016 10:20 AM Dorathy KinsmanVirginia Smith, CNM [facility]   Follow up Visit:No Follow-up on file.  Postpartum contraception: IUD Mirena  Newborn Data: Live born female  Birth Weight: 5 lb 8.9 oz (2520 g) APGAR: 8, 9  Baby Feeding: Bottle and Breast Disposition:home with mother   07/26/2016 Cam HaiSHAW, KIMBERLY, CNM  9:47 AM

## 2016-07-26 NOTE — Discharge Instructions (Signed)

## 2016-07-29 ENCOUNTER — Encounter: Payer: Medicaid Other | Admitting: Advanced Practice Midwife

## 2016-08-01 ENCOUNTER — Encounter (HOSPITAL_COMMUNITY): Payer: Self-pay | Admitting: *Deleted

## 2016-08-01 ENCOUNTER — Emergency Department (HOSPITAL_COMMUNITY)
Admission: EM | Admit: 2016-08-01 | Discharge: 2016-08-01 | Disposition: A | Discharge status: Home / Self Care | Payer: Medicaid Other | Attending: Emergency Medicine | Admitting: Emergency Medicine

## 2016-08-01 DIAGNOSIS — F17210 Nicotine dependence, cigarettes, uncomplicated: Secondary | ICD-10-CM | POA: Insufficient documentation

## 2016-08-01 DIAGNOSIS — Z79899 Other long term (current) drug therapy: Secondary | ICD-10-CM | POA: Diagnosis not present

## 2016-08-01 DIAGNOSIS — R102 Pelvic and perineal pain: Secondary | ICD-10-CM | POA: Diagnosis not present

## 2016-08-01 DIAGNOSIS — O9989 Other specified diseases and conditions complicating pregnancy, childbirth and the puerperium: Secondary | ICD-10-CM | POA: Diagnosis not present

## 2016-08-01 LAB — URINALYSIS, ROUTINE W REFLEX MICROSCOPIC
Bilirubin Urine: NEGATIVE
Glucose, UA: NEGATIVE mg/dL
Hgb urine dipstick: NEGATIVE
Ketones, ur: NEGATIVE mg/dL
Leukocytes, UA: NEGATIVE
Nitrite: NEGATIVE
Protein, ur: NEGATIVE mg/dL
Specific Gravity, Urine: 1.005 (ref 1.005–1.030)
pH: 6 (ref 5.0–8.0)

## 2016-08-01 LAB — CBC WITH DIFFERENTIAL/PLATELET
Basophils Absolute: 0 10*3/uL (ref 0.0–0.1)
Basophils Relative: 0 %
Eosinophils Absolute: 0.2 10*3/uL (ref 0.0–0.7)
Eosinophils Relative: 2 %
HCT: 37.2 % (ref 36.0–46.0)
Hemoglobin: 12.9 g/dL (ref 12.0–15.0)
Lymphocytes Relative: 19 %
Lymphs Abs: 1.8 10*3/uL (ref 0.7–4.0)
MCH: 30.6 pg (ref 26.0–34.0)
MCHC: 34.7 g/dL (ref 30.0–36.0)
MCV: 88.4 fL (ref 78.0–100.0)
Monocytes Absolute: 0.4 10*3/uL (ref 0.1–1.0)
Monocytes Relative: 4 %
Neutro Abs: 7.1 10*3/uL (ref 1.7–7.7)
Neutrophils Relative %: 75 %
Platelets: 232 10*3/uL (ref 150–400)
RBC: 4.21 MIL/uL (ref 3.87–5.11)
RDW: 13.6 % (ref 11.5–15.5)
WBC: 9.5 10*3/uL (ref 4.0–10.5)

## 2016-08-01 LAB — BASIC METABOLIC PANEL
Anion gap: 9 (ref 5–15)
BUN: 9 mg/dL (ref 6–20)
CO2: 25 mmol/L (ref 22–32)
Calcium: 9.5 mg/dL (ref 8.9–10.3)
Chloride: 106 mmol/L (ref 101–111)
Creatinine, Ser: 0.73 mg/dL (ref 0.44–1.00)
GFR calc Af Amer: >60 mL/min (ref >60)
GFR calc non Af Amer: >60 mL/min (ref >60)
Glucose, Bld: 104 mg/dL — ABNORMAL HIGH (ref 65–99)
Potassium: 4.1 mmol/L (ref 3.5–5.1)
Sodium: 140 mmol/L (ref 135–145)

## 2016-08-01 MED ORDER — IBUPROFEN 800 MG PO TABS: 800.0000 mg | ORAL_TABLET | Freq: Three times a day (TID) | ORAL | 0 refills | Status: DC | PRN

## 2016-08-01 MED ORDER — SODIUM CHLORIDE 0.9 % IV BOLUS (SEPSIS)
1000.0000 mL | Freq: Once | INTRAVENOUS | Status: AC
  Administered 2016-08-01: 1000 mL via INTRAVENOUS

## 2016-08-01 NOTE — Discharge Instructions (Signed)
[facility] on Monday morning for an appointment as soon as possible.  Return here as needed,. if you have any changes or worsening in her symptoms between now and Monday, [facility]

## 2016-08-01 NOTE — ED Triage Notes (Signed)
Pt reports RLQ pain onset today with cramps, pt reports no change in vaginal bleeding, pt reports vaginal bleeding since pregnancy, pt takes Ibuprofen for cramps, pt denies n/v/d, pt A&O x4

## 2016-08-01 NOTE — ED Provider Notes (Signed)
[facility]-EMERGENCY DEPT Provider Note   CSN: [id number] Arrival date & time: 08/01/16  [id number]     History   Chief Complaint Chief Complaint  Patient presents with  . Abdominal Pain    HPI Latasha Mitchell is a 21 y.o. female.  HPI Patient presents to the emergency department with lower abdominal cramping.  The patient had a vaginal delivery 6 days ago.  She states she has had bleeding since that time.  She states she has also had some fluid.  The patient states that the cramping this morning was severe.  She states it is better at this time.  The patient states that nothing seems to make the condition better.  Palpation make the pain worse.  She took some Tylenol for her discomfort. The patient denies chest pain, shortness of breath, headache,blurred vision, neck pain, fever, cough, weakness, numbness, dizziness, anorexia, edema,  nausea, vomiting, diarrhea, rash, back pain, dysuria, hematemesis, bloody stool, near syncope, or syncope. Past Medical History:  Diagnosis Date  . Bronchitis   . Former smoker   . History of marijuana use   . Kidney infection   . Late prenatal care    at 24 wks  . UTI (urinary tract infection)     Patient Active Problem List   Diagnosis Date Noted  . Pregnant and not yet delivered 07/24/2016  . Hemorrhoids 07/16/2016  . Supervision of normal pregnancy in second trimester 05/01/2016  . Marijuana use 10/24/2014  . Previous cesarean delivery affecting pregnancy, antepartum 10/18/2014    Past Surgical History:  Procedure Laterality Date  . CESAREAN SECTION    . DILATION AND CURETTAGE OF UTERUS      OB History    Gravida Para Term Preterm AB Living   5 3 3  0 2 3   SAB TAB Ectopic Multiple Live Births   2 0 0 0 3       Home Medications    Prior to Admission medications   Medication Sig Start Date End Date Taking? Authorizing Provider  albuterol (PROVENTIL HFA;VENTOLIN HFA) 108 (90 BASE) MCG/ACT inhaler Inhale 2 puffs into the lungs every 6  (six) hours as needed for shortness of breath. 01/03/15  Yes Dorathy KinsmanVirginia Smith, CNM  ibuprofen (ADVIL,MOTRIN) 600 MG tablet Take 1 tablet (600 mg total) by mouth every 6 (six) hours as needed. Patient taking differently: Take 600 mg by mouth every 6 (six) hours as needed for moderate pain.  07/26/16  Yes Arabella MerlesKimberly D Shaw, CNM  Prenatal Vit-Fe Fumarate-FA (PREPLUS) 27-1 MG TABS Take 1 tablet by mouth daily. 05/01/16  Yes Hermina StaggersMichael L Ervin, MD    Family History Family History  Problem Relation Age of Onset  . Heart disease Maternal Grandfather   . Diabetes Paternal Grandmother   . Hypertension Paternal Grandmother   . Stroke Paternal Grandmother   . Heart disease Paternal Grandfather     Social History Social History  Substance Use Topics  . Smoking status: Current Every Day Smoker    Packs/day: 0.25    Types: Cigarettes  . Smokeless tobacco: Never Used  . Alcohol use No     Allergies   Patient has no known allergies.   Review of Systems Review of Systems All other systems negative except as documented in the HPI. All pertinent positives and negatives as reviewed in the HPI.  Physical Exam Updated Vital Signs BP 127/84   Pulse 68   Temp 98.2 F (36.8 C) (Oral)   Resp 16   Ht 5'  7" (1.702 m)   Wt 70.3 kg   SpO2 100%   Breastfeeding? No   BMI 24.28 kg/m   Physical Exam  Constitutional: She is oriented to person, place, and time. She appears well-developed and well-nourished. No distress.  HENT:  Head: Normocephalic and atraumatic.  Mouth/Throat: Oropharynx is clear and moist.  Eyes: Pupils are equal, round, and reactive to light.  Neck: Normal range of motion. Neck supple.  Cardiovascular: Normal rate, regular rhythm and normal heart sounds.  Exam reveals no gallop and no friction rub.   No murmur heard. Pulmonary/Chest: Effort normal and breath sounds normal. No respiratory distress. She has no wheezes.  Abdominal: Soft. Bowel sounds are normal. She exhibits no  distension and no mass. There is tenderness. There is no rebound and no guarding.    Neurological: She is alert and oriented to person, place, and time. She exhibits normal muscle tone. Coordination normal.  Skin: Skin is warm and dry. No rash noted. No erythema.  Psychiatric: She has a normal mood and affect. Her behavior is normal.  Nursing note and vitals reviewed.    ED Treatments / Results  Labs (all labs ordered are listed, but only abnormal results are displayed) Labs Reviewed  BASIC METABOLIC PANEL - Abnormal; Notable for the following:       Result Value   Glucose, Bld 104 (*)    All other components within normal limits  URINALYSIS, ROUTINE W REFLEX MICROSCOPIC - Abnormal; Notable for the following:    Color, Urine STRAW (*)    All other components within normal limits  CBC WITH DIFFERENTIAL/PLATELET    EKG  EKG Interpretation None       Radiology No results found.  Procedures Procedures (including critical care time)  Medications Ordered in ED Medications  sodium chloride 0.9 % bolus 1,000 mL (0 mLs Intravenous Stopped 08/01/16 1352)     Initial Impression / Assessment and Plan / ED Course  I have reviewed the triage vital signs and the nursing notes.  Pertinent labs & imaging results that were available during my care of the patient were reviewed by me and considered in my medical decision making (see chart for details).   I spoke with the on-call GYN from [facility] hospital and she wants to see the patient in the clinic next week.  Patient is advised to get MAU for any worsening in her condition.  Patient agrees the plan and all questions were answered.  This could be a retained product from her delivery and will need to return MAU for any worsening    Final Clinical Impressions(s) / ED Diagnoses   Final diagnoses:  None    New Prescriptions New Prescriptions   No medications on file     Charlestine Night, PA-C 08/03/16 9604    Benjiman Core, MD 08/03/16 1526

## 2016-08-05 ENCOUNTER — Encounter: Payer: Medicaid Other | Admitting: Student

## 2016-08-12 ENCOUNTER — Encounter: Payer: Medicaid Other | Admitting: Student

## 2016-08-25 ENCOUNTER — Ambulatory Visit: Payer: Medicaid Other | Admitting: Advanced Practice Midwife

## 2016-09-08 ENCOUNTER — Ambulatory Visit: Payer: Medicaid Other | Admitting: Student

## 2016-09-17 ENCOUNTER — Ambulatory Visit: Payer: Medicaid Other | Admitting: Advanced Practice Midwife

## 2017-10-23 ENCOUNTER — Other Ambulatory Visit: Payer: Self-pay

## 2017-10-23 ENCOUNTER — Emergency Department (HOSPITAL_COMMUNITY)
Admission: EM | Admit: 2017-10-23 | Discharge: 2017-10-23 | Disposition: A | Discharge status: Home / Self Care | Payer: Self-pay | Attending: Emergency Medicine | Admitting: Emergency Medicine

## 2017-10-23 ENCOUNTER — Encounter (HOSPITAL_COMMUNITY): Payer: Self-pay

## 2017-10-23 DIAGNOSIS — F1210 Cannabis abuse, uncomplicated: Secondary | ICD-10-CM | POA: Insufficient documentation

## 2017-10-23 DIAGNOSIS — R21 Rash and other nonspecific skin eruption: Secondary | ICD-10-CM | POA: Insufficient documentation

## 2017-10-23 DIAGNOSIS — F17210 Nicotine dependence, cigarettes, uncomplicated: Secondary | ICD-10-CM | POA: Insufficient documentation

## 2017-10-23 DIAGNOSIS — Z79899 Other long term (current) drug therapy: Secondary | ICD-10-CM | POA: Insufficient documentation

## 2017-10-23 LAB — BASIC METABOLIC PANEL
Anion gap: 9 (ref 5–15)
BUN: 13 mg/dL (ref 6–20)
CALCIUM: 9.2 mg/dL (ref 8.9–10.3)
CHLORIDE: 108 mmol/L (ref 101–111)
CO2: 24 mmol/L (ref 22–32)
CREATININE: 0.8 mg/dL (ref 0.44–1.00)
GFR calc non Af Amer: >60 mL/min (ref >60)
GLUCOSE: 107 mg/dL — AB (ref 65–99)
Potassium: 3.6 mmol/L (ref 3.5–5.1)
Sodium: 141 mmol/L (ref 135–145)

## 2017-10-23 LAB — CBC
HEMATOCRIT: 38.9 % (ref 36.0–46.0)
HEMOGLOBIN: 13.3 g/dL (ref 12.0–15.0)
MCH: 30.4 pg (ref 26.0–34.0)
MCHC: 34.2 g/dL (ref 30.0–36.0)
MCV: 89 fL (ref 78.0–100.0)
Platelets: 208 10*3/uL (ref 150–400)
RBC: 4.37 MIL/uL (ref 3.87–5.11)
RDW: 13.1 % (ref 11.5–15.5)
WBC: 5.5 10*3/uL (ref 4.0–10.5)

## 2017-10-23 MED ORDER — DIPHENHYDRAMINE HCL 25 MG PO CAPS: 25.0000 mg | ORAL_CAPSULE | Freq: Four times a day (QID) | ORAL | 0 refills | Status: AC | PRN

## 2017-10-23 MED ORDER — CLOTRIMAZOLE 1 % EX OINT: 1.0000 "application " | TOPICAL_OINTMENT | Freq: Two times a day (BID) | CUTANEOUS | 0 refills | Status: DC

## 2017-10-23 NOTE — ED Triage Notes (Signed)
Patient c/o nail bed issues, rash, and a skin odor x 3 days.

## 2017-10-23 NOTE — ED Provider Notes (Addendum)
[facility]-EMERGENCY DEPT Provider Note   CSN: [id number] Arrival date & time: 10/23/17  0800     History   Chief Complaint Chief Complaint  Patient presents with  . skin issues  . Rash    HPI Latasha Mitchell is a 22 y.o. female.  HPI 22 year old female comes in with chief complaint of skin rash.  Patient is a smoker and has history of polysubstance abuse including IV meth.  She states that she has been trying to clean up and was doing well for at least 9 months until recently she did relapse.  Patient's current visit however is for a skin rash.  Patient states that she has noticed eruption of a rash for about 3 months all over her body.  She was seen at a hospital in [place] where she was started on doxycycline.  Patient just finished a course about 2 weeks ago and she states that her symptoms have continued to progress.  Today she was showering and she felt like there were things coming out of her pores and her rash was more itchy than usual.  She also is concerned that maybe she saw an insect come out of 1 of the pores.  Patient's rash is itchy.  She denies any bloody drainage or purulent drainage.  No fevers or chills.  Patient does not have any known skin disorder.  She thinks that she bruises more than usual.  Past Medical History:  Diagnosis Date  . Bronchitis   . Former smoker   . History of marijuana use   . Kidney infection   . Late prenatal care    at 24 wks  . UTI (urinary tract infection)     Patient Active Problem List   Diagnosis Date Noted  . Pregnant and not yet delivered 07/24/2016  . Hemorrhoids 07/16/2016  . Supervision of normal pregnancy in second trimester 05/01/2016  . Marijuana use 10/24/2014  . Previous cesarean delivery affecting pregnancy, antepartum 10/18/2014    Past Surgical History:  Procedure Laterality Date  . CESAREAN SECTION    . DILATION AND CURETTAGE OF UTERUS       OB History    Gravida  5   Para    3   Term  3   Preterm  0   AB  2   Living  3     SAB  2   TAB  0   Ectopic  0   Multiple  0   Live Births  3            Home Medications    Prior to Admission medications   Medication Sig Start Date End Date Taking? Authorizing Provider  Prenatal Vit-Fe Fumarate-FA (PREPLUS) 27-1 MG TABS Take 1 tablet by mouth daily. 05/01/16  Yes Hermina Staggers, MD  albuterol (PROVENTIL HFA;VENTOLIN HFA) 108 (90 BASE) MCG/ACT inhaler Inhale 2 puffs into the lungs every 6 (six) hours as needed for shortness of breath. 01/03/15   Katrinka Blazing, [place], CNM  Clotrimazole 1 % OINT Apply 1 application topically 2 (two) times daily. 10/23/17   Derwood Kaplan, MD  diphenhydrAMINE (BENADRYL) 25 mg capsule Take 1 capsule (25 mg total) by mouth every 6 (six) hours as needed for itching. 10/23/17   Derwood Kaplan, MD  ibuprofen (ADVIL,MOTRIN) 800 MG tablet Take 1 tablet (800 mg total) by mouth every 8 (eight) hours as needed. Patient not taking: Reported on 10/23/2017 08/01/16   Charlestine Night, PA-C    Family  History Family History  Problem Relation Age of Onset  . Heart disease Maternal Grandfather   . Diabetes Paternal Grandmother   . Hypertension Paternal Grandmother   . Stroke Paternal Grandmother   . Heart disease Paternal Grandfather     Social History Social History   Tobacco Use  . Smoking status: Current Every Day Smoker    Packs/day: 0.50    Types: Cigarettes  . Smokeless tobacco: Never Used  Substance Use Topics  . Alcohol use: No  . Drug use: Yes    Types: Marijuana     Allergies   Patient has no known allergies.   Review of Systems Review of Systems  Constitutional: Positive for activity change.  Gastrointestinal: Negative for nausea and vomiting.  Skin: Positive for rash.  Allergic/Immunologic: Negative for immunocompromised state.  Hematological: Does not bruise/bleed easily.     Physical Exam Updated Vital Signs BP 133/83 (BP Location: Right Arm)    Pulse 77   Temp 97.7 F (36.5 C) (Oral)   Resp 16   Ht  (1.702 m)   Wt 54.4 kg (120 lb)   LMP 10/23/2017   SpO2 100%   BMI 18.79 kg/m   Physical Exam  Constitutional: She is oriented to person, place, and time. She appears well-developed.  HENT:  Head: Normocephalic and atraumatic.  Eyes: EOM are normal.  Neck: Normal range of motion. Neck supple.  Cardiovascular: Normal rate.  Pulmonary/Chest: Effort normal.  Abdominal: Bowel sounds are normal.  Neurological: She is alert and oriented to person, place, and time.  Skin: Skin is warm and dry. Rash noted.  Nursing note and vitals reviewed.    ED Treatments / Results  Labs (all labs ordered are listed, but only abnormal results are displayed) Labs Reviewed  BASIC METABOLIC PANEL - Abnormal; Notable for the following components:      Result Value   Glucose, Bld 107 (*)    All other components within normal limits  CBC    EKG None  Radiology No results found.  Procedures Procedures (including critical care time)  Medications Ordered in ED Medications - No data to display   Initial Impression / Assessment and Plan / ED Course  I have reviewed the triage vital signs and the nursing notes.  Pertinent labs & imaging results that were available during my care of the patient were reviewed by me and considered in my medical decision making (see chart for details).     Patient comes in with chief complaint of rash. Patient has history of IV meth use.  She does not have any skin disorder.  Her current symptoms have been present for 3 months and she is status post a course of doxycycline without improvement.  She feels like her ports are excreting bugs and there is also a foul order to her skin which is new.  I did consider that patient might have skin picking due to meth use, but it does not appear that all her lesions are excoriation type.  She does have several areas of rash which are just maculopapular,  erythematous and nonblanching lesions.  Rash around her fingernail appears to be eroding.  Fungal infection is a possibility.  I have advised patient to follow-up with dermatology and infectious disease for further evaluation.  For now we will give her topical antifungal and Benadryl.  Final Clinical Impressions(s) / ED Diagnoses   Final diagnoses:  Rash and nonspecific skin eruption    ED Discharge Orders  Ordered    diphenhydrAMINE (BENADRYL) 25 mg capsule  Every 6 hours PRN     10/23/17 0857    Clotrimazole 1 % OINT  2 times daily     10/23/17 0857       Derwood Kaplan, MD 10/23/17 1610    Derwood Kaplan, MD 10/23/17 1040

## 2017-10-23 NOTE — Discharge Instructions (Addendum)
Please see a dermatologist for further evaluation. We do think you are at high risk of skin infection as well, thus infectious disease follow up is also recommended.

## 2018-02-20 ENCOUNTER — Other Ambulatory Visit: Payer: Self-pay

## 2018-02-20 ENCOUNTER — Encounter (HOSPITAL_COMMUNITY): Payer: Self-pay | Admitting: Emergency Medicine

## 2018-02-20 ENCOUNTER — Emergency Department (HOSPITAL_COMMUNITY)
Admission: EM | Admit: 2018-02-20 | Discharge: 2018-02-21 | Disposition: A | Discharge status: Home / Self Care | Payer: Self-pay | Attending: Emergency Medicine | Admitting: Emergency Medicine

## 2018-02-20 DIAGNOSIS — Z79899 Other long term (current) drug therapy: Secondary | ICD-10-CM | POA: Insufficient documentation

## 2018-02-20 DIAGNOSIS — F17210 Nicotine dependence, cigarettes, uncomplicated: Secondary | ICD-10-CM | POA: Insufficient documentation

## 2018-02-20 DIAGNOSIS — R21 Rash and other nonspecific skin eruption: Secondary | ICD-10-CM | POA: Insufficient documentation

## 2018-02-20 LAB — POC URINE PREG, ED: PREG TEST UR: NEGATIVE

## 2018-02-20 MED ORDER — DOXYCYCLINE HYCLATE 100 MG PO CAPS: 100.0000 mg | ORAL_CAPSULE | Freq: Two times a day (BID) | ORAL | 0 refills | Status: DC

## 2018-02-20 NOTE — ED Provider Notes (Signed)
[facility] Provider Note   CSN: [id number] Arrival date & time: 02/20/18  2115     History   Chief Complaint Chief Complaint  Patient presents with  . Rash    HPI Latasha Mitchell is a 22 y.o. female.  The history is provided by the patient. No language interpreter was used.  Rash       22 year old female with history of polysubstance abuse who is been clean for the past 60 days presenting complaining of a rash.  Patient developed a slightly itchy but painful rash ongoing for over a week.  Rash initially started in her shoulders and has spread throughout her face neck upper back and legs.  She also complaining of enlarged lymph nodes in her neck that is tender as well as having dental pain.  She denies fever, headache, neck stiffness, chest pain, trouble breathing, abdominal cramping or joint pain.  She denies any one else with similar rash.  She denies any change in her environment, soap or detergent on any medication changes.  She tries over-the-counter medication without relief.  Past Medical History:  Diagnosis Date  . Bronchitis   . Former smoker   . History of marijuana use   . Kidney infection   . Late prenatal care    at 24 wks  . UTI (urinary tract infection)     Patient Active Problem List   Diagnosis Date Noted  . Pregnant and not yet delivered 07/24/2016  . Hemorrhoids 07/16/2016  . Supervision of normal pregnancy in second trimester 05/01/2016  . Marijuana use 10/24/2014  . Previous cesarean delivery affecting pregnancy, antepartum 10/18/2014    Past Surgical History:  Procedure Laterality Date  . CESAREAN SECTION    . DILATION AND CURETTAGE OF UTERUS       OB History    Gravida  5   Para  3   Term  3   Preterm  0   AB  2   Living  3     SAB  2   TAB  0   Ectopic  0   Multiple  0   Live Births  3            Home Medications    Prior to Admission medications   Medication Sig Start  Date End Date Taking? Authorizing Provider  albuterol (PROVENTIL HFA;VENTOLIN HFA) 108 (90 BASE) MCG/ACT inhaler Inhale 2 puffs into the lungs every 6 (six) hours as needed for shortness of breath. 01/03/15   Katrinka BlazingSmith, [place], CNM  Clotrimazole 1 % OINT Apply 1 application topically 2 (two) times daily. 10/23/17   Derwood KaplanNanavati, Ankit, MD  diphenhydrAMINE (BENADRYL) 25 mg capsule Take 1 capsule (25 mg total) by mouth every 6 (six) hours as needed for itching. 10/23/17   Derwood KaplanNanavati, Ankit, MD  ibuprofen (ADVIL,MOTRIN) 800 MG tablet Take 1 tablet (800 mg total) by mouth every 8 (eight) hours as needed. Patient not taking: Reported on 10/23/2017 08/01/16   Charlestine NightLawyer, Christopher, PA-C  Prenatal Vit-Fe Fumarate-FA (PREPLUS) 27-1 MG TABS Take 1 tablet by mouth daily. 05/01/16   Hermina StaggersErvin, Michael L, MD    Family History Family History  Problem Relation Age of Onset  . Heart disease Maternal Grandfather   . Diabetes Paternal Grandmother   . Hypertension Paternal Grandmother   . Stroke Paternal Grandmother   . Heart disease Paternal Grandfather     Social History Social History   Tobacco Use  . Smoking status: Current Every  Day Smoker    Packs/day: 0.50    Types: Cigarettes  . Smokeless tobacco: Never Used  Substance Use Topics  . Alcohol use: No  . Drug use: Yes    Types: Marijuana     Allergies   Patient has no known allergies.   Review of Systems Review of Systems  Constitutional: Negative for fever.  Skin: Positive for rash.  Neurological: Negative for light-headedness.     Physical Exam Updated Vital Signs BP 120/65 (BP Location: Right Arm)   Pulse 70   Temp 99 F (37.2 C) (Oral)   Resp 16   SpO2 100%   Physical Exam  Constitutional: She appears well-developed and well-nourished. No distress.  HENT:  Head: Atraumatic.  Mild scattered dental decay without significant dental pain.  Eyes: Conjunctivae are normal.  Neck: Neck supple.  Cardiovascular: Normal rate and regular rhythm.    Pulmonary/Chest: Effort normal and breath sounds normal.  Abdominal: Soft. Bowel sounds are normal. She exhibits no distension. There is no tenderness.  Lymphadenopathy:    She has cervical adenopathy.  Neurological: She is alert.  Skin: Rash (Maculopapular erythematous rash noted scattered throughout face, neck, upper back, upper chest and bilateral thigh and forearm with acne appearance) noted.  No rash on palms of hands or soles of feet  Psychiatric: She has a normal mood and affect.  Nursing note and vitals reviewed.    ED Treatments / Results  Labs (all labs ordered are listed, but only abnormal results are displayed) Labs Reviewed  POC URINE PREG, ED    EKG None  Radiology No results found.  Procedures Procedures (including critical care time)  Medications Ordered in ED Medications - No data to display   Initial Impression / Assessment and Plan / ED Course  I have reviewed the triage vital signs and the nursing notes.  Pertinent labs & imaging results that were available during my care of the patient were reviewed by me and considered in my medical decision making (see chart for details).     BP 120/65 (BP Location: Right Arm)   Pulse 70   Temp 99 F (37.2 C) (Oral)   Resp 16   SpO2 100%    Final Clinical Impressions(s) / ED Diagnoses   Final diagnoses:  Rash and nonspecific skin eruption    ED Discharge Orders         Ordered    doxycycline (VIBRAMYCIN) 100 MG capsule  2 times daily     02/20/18 2357         11:28 PM Patient here with acne-like rash throughout her body.  She is well-appearing low suspicion for meningococcal.  No joint pain no rash on palms of hands or soles of feet.  Will treat with doxycycline.  Doubt allergic reaction. Doubt syphilis.  Pt is well appearing.  Return precaution given. preg test negative.    Fayrene Helper, PA-C 02/21/18 0002    Lockie Mola, Adam, DO 02/21/18 1108

## 2018-02-20 NOTE — Discharge Instructions (Signed)
Take antibiotic prescribed for your rash.  If no improvement, please call and follow up with dermatologist for further care.

## 2018-02-20 NOTE — ED Triage Notes (Signed)
Pt reports rash on her face that is spread to her neck and shoulders.  Pt reports she has never had this, despite being "clean" for 60 days.

## 2018-02-21 NOTE — ED Notes (Signed)
Reviewed d/c instructions with pt, who verbalized understanding and had no outstanding questions. Pt departed in NAD, refused use of wheelchair.   

## 2018-09-29 ENCOUNTER — Encounter: Payer: Self-pay | Admitting: *Deleted

## 2018-11-18 ENCOUNTER — Encounter: Payer: Self-pay | Admitting: *Deleted

## 2019-03-17 DIAGNOSIS — L709 Acne, unspecified: Secondary | ICD-10-CM | POA: Diagnosis not present

## 2019-03-17 DIAGNOSIS — B182 Chronic viral hepatitis C: Secondary | ICD-10-CM | POA: Diagnosis not present

## 2019-03-17 DIAGNOSIS — L608 Other nail disorders: Secondary | ICD-10-CM | POA: Diagnosis not present

## 2019-03-30 DIAGNOSIS — Z7189 Other specified counseling: Secondary | ICD-10-CM | POA: Diagnosis not present

## 2019-03-30 DIAGNOSIS — L700 Acne vulgaris: Secondary | ICD-10-CM | POA: Diagnosis not present

## 2019-04-13 DIAGNOSIS — Z1159 Encounter for screening for other viral diseases: Secondary | ICD-10-CM | POA: Diagnosis not present

## 2019-04-13 DIAGNOSIS — L608 Other nail disorders: Secondary | ICD-10-CM | POA: Diagnosis not present

## 2019-04-13 DIAGNOSIS — Z23 Encounter for immunization: Secondary | ICD-10-CM | POA: Diagnosis not present

## 2019-04-13 DIAGNOSIS — B182 Chronic viral hepatitis C: Secondary | ICD-10-CM | POA: Diagnosis not present

## 2019-04-22 DIAGNOSIS — M79674 Pain in right toe(s): Secondary | ICD-10-CM | POA: Diagnosis not present

## 2019-04-22 DIAGNOSIS — M79675 Pain in left toe(s): Secondary | ICD-10-CM | POA: Diagnosis not present

## 2019-04-22 DIAGNOSIS — B351 Tinea unguium: Secondary | ICD-10-CM | POA: Diagnosis not present

## 2019-05-03 DIAGNOSIS — L700 Acne vulgaris: Secondary | ICD-10-CM | POA: Diagnosis not present

## 2019-05-03 DIAGNOSIS — Z7189 Other specified counseling: Secondary | ICD-10-CM | POA: Diagnosis not present

## 2019-06-01 DIAGNOSIS — L700 Acne vulgaris: Secondary | ICD-10-CM | POA: Diagnosis not present

## 2019-07-01 DIAGNOSIS — L700 Acne vulgaris: Secondary | ICD-10-CM | POA: Diagnosis not present

## 2019-07-01 DIAGNOSIS — Z79899 Other long term (current) drug therapy: Secondary | ICD-10-CM | POA: Diagnosis not present

## 2019-07-12 DIAGNOSIS — B182 Chronic viral hepatitis C: Secondary | ICD-10-CM | POA: Diagnosis not present

## 2019-07-12 DIAGNOSIS — F17210 Nicotine dependence, cigarettes, uncomplicated: Secondary | ICD-10-CM | POA: Diagnosis not present

## 2019-07-26 DIAGNOSIS — Z79899 Other long term (current) drug therapy: Secondary | ICD-10-CM | POA: Diagnosis not present

## 2019-07-26 DIAGNOSIS — B182 Chronic viral hepatitis C: Secondary | ICD-10-CM | POA: Diagnosis not present

## 2019-07-26 DIAGNOSIS — L700 Acne vulgaris: Secondary | ICD-10-CM | POA: Diagnosis not present

## 2019-08-23 DIAGNOSIS — L700 Acne vulgaris: Secondary | ICD-10-CM | POA: Diagnosis not present

## 2019-08-23 DIAGNOSIS — Z79899 Other long term (current) drug therapy: Secondary | ICD-10-CM | POA: Diagnosis not present

## 2019-11-22 DIAGNOSIS — M545 Low back pain: Secondary | ICD-10-CM | POA: Diagnosis not present

## 2020-01-19 DIAGNOSIS — Z0000 Encounter for general adult medical examination without abnormal findings: Secondary | ICD-10-CM | POA: Diagnosis not present

## 2020-01-19 DIAGNOSIS — F410 Panic disorder [episodic paroxysmal anxiety] without agoraphobia: Secondary | ICD-10-CM | POA: Diagnosis not present

## 2020-01-19 DIAGNOSIS — E559 Vitamin D deficiency, unspecified: Secondary | ICD-10-CM | POA: Diagnosis not present

## 2020-01-19 DIAGNOSIS — F341 Dysthymic disorder: Secondary | ICD-10-CM | POA: Diagnosis not present

## 2020-01-19 DIAGNOSIS — Z1389 Encounter for screening for other disorder: Secondary | ICD-10-CM | POA: Diagnosis not present

## 2020-01-19 DIAGNOSIS — Z1331 Encounter for screening for depression: Secondary | ICD-10-CM | POA: Diagnosis not present

## 2020-01-19 DIAGNOSIS — F3181 Bipolar II disorder: Secondary | ICD-10-CM | POA: Diagnosis not present

## 2020-01-19 DIAGNOSIS — F17210 Nicotine dependence, cigarettes, uncomplicated: Secondary | ICD-10-CM | POA: Diagnosis not present

## 2020-01-19 DIAGNOSIS — B182 Chronic viral hepatitis C: Secondary | ICD-10-CM | POA: Diagnosis not present

## 2020-01-19 DIAGNOSIS — Z13220 Encounter for screening for lipoid disorders: Secondary | ICD-10-CM | POA: Diagnosis not present

## 2020-01-19 DIAGNOSIS — Z131 Encounter for screening for diabetes mellitus: Secondary | ICD-10-CM | POA: Diagnosis not present

## 2020-01-19 DIAGNOSIS — F4310 Post-traumatic stress disorder, unspecified: Secondary | ICD-10-CM | POA: Diagnosis not present

## 2020-01-19 DIAGNOSIS — Z114 Encounter for screening for human immunodeficiency virus [HIV]: Secondary | ICD-10-CM | POA: Diagnosis not present

## 2020-01-25 DIAGNOSIS — Z113 Encounter for screening for infections with a predominantly sexual mode of transmission: Secondary | ICD-10-CM | POA: Diagnosis not present

## 2020-01-25 DIAGNOSIS — R877 Abnormal histological findings in specimens from female genital organs: Secondary | ICD-10-CM | POA: Diagnosis not present

## 2020-01-25 DIAGNOSIS — N946 Dysmenorrhea, unspecified: Secondary | ICD-10-CM | POA: Diagnosis not present

## 2020-03-28 ENCOUNTER — Other Ambulatory Visit: Payer: Self-pay

## 2020-03-28 ENCOUNTER — Encounter: Payer: Self-pay | Admitting: Emergency Medicine

## 2020-03-28 ENCOUNTER — Ambulatory Visit
Admission: EM | Admit: 2020-03-28 | Discharge: 2020-03-28 | Disposition: A | Discharge status: Home / Self Care | Payer: BLUE CROSS/BLUE SHIELD | Attending: Emergency Medicine | Admitting: Emergency Medicine

## 2020-03-28 DIAGNOSIS — J069 Acute upper respiratory infection, unspecified: Secondary | ICD-10-CM | POA: Diagnosis not present

## 2020-03-28 DIAGNOSIS — Z1152 Encounter for screening for COVID-19: Secondary | ICD-10-CM | POA: Diagnosis not present

## 2020-03-28 LAB — POCT RAPID STREP A (OFFICE): Rapid Strep A Screen: NEGATIVE

## 2020-03-28 MED ORDER — CETIRIZINE HCL 10 MG PO CAPS: 10.0000 mg | ORAL_CAPSULE | Freq: Every day | ORAL | 0 refills | Status: AC

## 2020-03-28 MED ORDER — BENZONATATE 200 MG PO CAPS: 200.0000 mg | ORAL_CAPSULE | Freq: Three times a day (TID) | ORAL | 0 refills | Status: AC | PRN

## 2020-03-28 MED ORDER — DM-GUAIFENESIN ER 30-600 MG PO TB12: 1.0000 | ORAL_TABLET | Freq: Two times a day (BID) | ORAL | 0 refills | Status: AC

## 2020-03-28 NOTE — ED Triage Notes (Signed)
Pt here for cough and nasal congestion x 3 days 

## 2020-03-28 NOTE — Discharge Instructions (Addendum)
Strep test negative, Covid test pending.   Begin daily cetirizine to help with postnasal drainage, throat irritation Begin Mucinex DM to further help with congestion/cough Tessalon every 8 hours as needed for cough Rest and fluids Tylenol and ibuprofen as needed for throat pain Honey/lemon/ginger and hot tea to help soothe throat Follow-up if not improving or worsening

## 2020-03-28 NOTE — ED Provider Notes (Signed)
[facility]    CSN: [id number] Arrival date & time: 03/28/20  1636      History   Chief Complaint Chief Complaint  Patient presents with  . Cough  . Sore Throat    HPI Latasha Mitchell is a 24 y.o. female presenting today for evaluation of cough and throat irritation.  Patient reports over the past 2 days she has had throat irritation, raspy voice as well as a dry cough.  Denies any rhinorrhea.  Denies fevers.  Does report possible Covid exposure at work.  Denies GI symptoms.  HPI  Past Medical History:  Diagnosis Date  . Bronchitis   . Former smoker   . History of marijuana use   . Kidney infection   . Late prenatal care    at 24 wks  . UTI (urinary tract infection)     Patient Active Problem List   Diagnosis Date Noted  . Pregnant and not yet delivered 07/24/2016  . Hemorrhoids 07/16/2016  . Supervision of normal pregnancy in second trimester 05/01/2016  . Marijuana use 10/24/2014  . Previous cesarean delivery affecting pregnancy, antepartum 10/18/2014    Past Surgical History:  Procedure Laterality Date  . CESAREAN SECTION    . DILATION AND CURETTAGE OF UTERUS      OB History    Gravida  5   Para  3   Term  3   Preterm  0   AB  2   Living  3     SAB  2   TAB  0   Ectopic  0   Multiple  0   Live Births  3            Home Medications    Prior to Admission medications   Medication Sig Start Date End Date Taking? Authorizing Provider  albuterol (PROVENTIL HFA;VENTOLIN HFA) 108 (90 BASE) MCG/ACT inhaler Inhale 2 puffs into the lungs every 6 (six) hours as needed for shortness of breath. 01/03/15   Katrinka Blazing, [place], CNM  benzonatate (TESSALON) 200 MG capsule Take 1 capsule (200 mg total) by mouth 3 (three) times daily as needed for up to 7 days for cough. 03/28/20 04/04/20  Nadiya Pieratt C, PA-C  Cetirizine HCl 10 MG CAPS Take 1 capsule (10 mg total) by mouth daily. 03/28/20   Markel Kurtenbach C, PA-C    dextromethorphan-guaiFENesin (MUCINEX DM) 30-600 MG 12hr tablet Take 1 tablet by mouth 2 (two) times daily. 03/28/20   Javell Blackburn C, PA-C  diphenhydrAMINE (BENADRYL) 25 mg capsule Take 1 capsule (25 mg total) by mouth every 6 (six) hours as needed for itching. 10/23/17   Derwood Kaplan, MD  Prenatal Vit-Fe Fumarate-FA (PREPLUS) 27-1 MG TABS Take 1 tablet by mouth daily. 05/01/16   Hermina Staggers, MD    Family History Family History  Problem Relation Age of Onset  . Heart disease Maternal Grandfather   . Diabetes Paternal Grandmother   . Hypertension Paternal Grandmother   . Stroke Paternal Grandmother   . Heart disease Paternal Grandfather     Social History Social History   Tobacco Use  . Smoking status: Current Every Day Smoker    Packs/day: 0.50    Types: Cigarettes  . Smokeless tobacco: Never Used  Vaping Use  . Vaping Use: Never used  Substance Use Topics  . Alcohol use: No  . Drug use: Yes    Types: Marijuana     Allergies   Patient has no known allergies.  Review of Systems Review of Systems  Constitutional: Positive for fatigue. Negative for activity change, appetite change, chills and fever.  HENT: Positive for sore throat. Negative for congestion, ear pain, rhinorrhea, sinus pressure and trouble swallowing.   Eyes: Negative for discharge and redness.  Respiratory: Positive for cough. Negative for chest tightness and shortness of breath.   Cardiovascular: Negative for chest pain.  Gastrointestinal: Negative for abdominal pain, diarrhea, nausea and vomiting.  Musculoskeletal: Negative for myalgias.  Skin: Negative for rash.  Neurological: Negative for dizziness, light-headedness and headaches.     Physical Exam Triage Vital Signs ED Triage Vitals  Enc Vitals Group     BP      Pulse      Resp      Temp      Temp src      SpO2      Weight      Height      Head Circumference      Peak Flow      Pain Score      Pain Loc      Pain Edu?       Excl. in GC?    No data found.  Updated Vital Signs BP 119/73 (BP Location: Left Arm)   Pulse 67   Temp 98.6 F (37 C) (Oral)   Resp 18   SpO2 98%   Visual Acuity Right Eye Distance:   Left Eye Distance:   Bilateral Distance:    Right Eye Near:   Left Eye Near:    Bilateral Near:     Physical Exam Vitals and nursing note reviewed.  Constitutional:      Appearance: She is well-developed.     Comments: No acute distress  HENT:     Head: Normocephalic and atraumatic.     Ears:     Comments: Bilateral ears without tenderness to palpation of external auricle, tragus and mastoid, EAC's without erythema or swelling, TM's with good bony landmarks and cone of light. Non erythematous.     Nose: Nose normal.     Mouth/Throat:     Comments: Oral mucosa pink and moist, no tonsillar enlargement or exudate. Posterior pharynx patent and nonerythematous, no uvula deviation or swelling. Normal phonation. Eyes:     Conjunctiva/sclera: Conjunctivae normal.  Cardiovascular:     Rate and Rhythm: Normal rate.  Pulmonary:     Effort: Pulmonary effort is normal. No respiratory distress.     Comments: Breathing comfortably at rest, CTABL, no wheezing, rales or other adventitious sounds auscultated Abdominal:     General: There is no distension.  Musculoskeletal:        General: Normal range of motion.     Cervical back: Neck supple.  Skin:    General: Skin is warm and dry.  Neurological:     Mental Status: She is alert and oriented to person, place, and time.      UC Treatments / Results  Labs (all labs ordered are listed, but only abnormal results are displayed) Labs Reviewed  NOVEL CORONAVIRUS, NAA  CULTURE, GROUP A STREP [facility])  POCT RAPID STREP A (OFFICE)    EKG   Radiology No results found.  Procedures Procedures (including critical care time)  Medications Ordered in UC Medications - No data to display  Initial Impression / Assessment and Plan / UC Course  I  have reviewed the triage vital signs and the nursing notes.  Pertinent labs & imaging results that were available during my  care of the patient were reviewed by me and considered in my medical decision making (see chart for details).     Strep test negative, Covid test pending.  Suspect likely viral etiology and sore throat likely secondary to postnasal drainage.  Recommend symptomatic and supportive care.  Initiating on daily cetirizine, Mucinex DM and Tessalon.  Rest and fluids.  Honey and hot tea. Discussed strict return precautions. Patient verbalized understanding and is agreeable with plan.   Final Clinical Impressions(s) / UC Diagnoses   Final diagnoses:  Encounter for screening for COVID-19  Viral URI with cough     Discharge Instructions     Strep test negative, Covid test pending.   Begin daily cetirizine to help with postnasal drainage, throat irritation Begin Mucinex DM to further help with congestion/cough Tessalon every 8 hours as needed for cough Rest and fluids Tylenol and ibuprofen as needed for throat pain Honey/lemon/ginger and hot tea to help soothe throat Follow-up if not improving or worsening    ED Prescriptions    Medication Sig Dispense Auth. Provider   Cetirizine HCl 10 MG CAPS Take 1 capsule (10 mg total) by mouth daily. 15 capsule Keoki Mchargue C, PA-C   dextromethorphan-guaiFENesin (MUCINEX DM) 30-600 MG 12hr tablet Take 1 tablet by mouth 2 (two) times daily. 20 tablet Daira Hine C, PA-C   benzonatate (TESSALON) 200 MG capsule Take 1 capsule (200 mg total) by mouth 3 (three) times daily as needed for up to 7 days for cough. 28 capsule Shivansh Hardaway, [place] C, PA-C     PDMP not reviewed this encounter.   Lew Dawes, [place] 03/28/20 1811

## 2020-03-30 LAB — NOVEL CORONAVIRUS, NAA: SARS-CoV-2, NAA: NOT DETECTED

## 2020-03-30 LAB — SARS-COV-2, NAA 2 DAY TAT

## 2020-03-31 LAB — CULTURE, GROUP A STREP (THRC)

## 2020-04-24 DIAGNOSIS — B351 Tinea unguium: Secondary | ICD-10-CM | POA: Diagnosis not present

## 2020-04-24 DIAGNOSIS — M79675 Pain in left toe(s): Secondary | ICD-10-CM | POA: Diagnosis not present

## 2020-04-24 DIAGNOSIS — M79671 Pain in right foot: Secondary | ICD-10-CM | POA: Diagnosis not present

## 2020-04-24 DIAGNOSIS — M722 Plantar fascial fibromatosis: Secondary | ICD-10-CM | POA: Diagnosis not present

## 2020-07-25 DIAGNOSIS — Z20822 Contact with and (suspected) exposure to covid-19: Secondary | ICD-10-CM | POA: Diagnosis not present

## 2020-09-13 DIAGNOSIS — F4310 Post-traumatic stress disorder, unspecified: Secondary | ICD-10-CM | POA: Diagnosis not present

## 2020-09-13 DIAGNOSIS — F410 Panic disorder [episodic paroxysmal anxiety] without agoraphobia: Secondary | ICD-10-CM | POA: Diagnosis not present

## 2020-09-13 DIAGNOSIS — F3181 Bipolar II disorder: Secondary | ICD-10-CM | POA: Diagnosis not present

## 2020-09-13 DIAGNOSIS — F341 Dysthymic disorder: Secondary | ICD-10-CM | POA: Diagnosis not present

## 2020-10-16 DIAGNOSIS — F410 Panic disorder [episodic paroxysmal anxiety] without agoraphobia: Secondary | ICD-10-CM | POA: Diagnosis not present

## 2020-10-16 DIAGNOSIS — F3181 Bipolar II disorder: Secondary | ICD-10-CM | POA: Diagnosis not present

## 2020-10-16 DIAGNOSIS — F4310 Post-traumatic stress disorder, unspecified: Secondary | ICD-10-CM | POA: Diagnosis not present

## 2020-10-24 DIAGNOSIS — F410 Panic disorder [episodic paroxysmal anxiety] without agoraphobia: Secondary | ICD-10-CM | POA: Diagnosis not present

## 2020-10-24 DIAGNOSIS — F341 Dysthymic disorder: Secondary | ICD-10-CM | POA: Diagnosis not present

## 2020-10-24 DIAGNOSIS — F3181 Bipolar II disorder: Secondary | ICD-10-CM | POA: Diagnosis not present

## 2020-10-24 DIAGNOSIS — F4310 Post-traumatic stress disorder, unspecified: Secondary | ICD-10-CM | POA: Diagnosis not present

## 2020-11-08 DIAGNOSIS — Z01419 Encounter for gynecological examination (general) (routine) without abnormal findings: Secondary | ICD-10-CM | POA: Diagnosis not present

## 2020-11-08 DIAGNOSIS — Z23 Encounter for immunization: Secondary | ICD-10-CM | POA: Diagnosis not present

## 2020-11-08 DIAGNOSIS — Z124 Encounter for screening for malignant neoplasm of cervix: Secondary | ICD-10-CM | POA: Diagnosis not present

## 2020-11-21 DIAGNOSIS — F3181 Bipolar II disorder: Secondary | ICD-10-CM | POA: Diagnosis not present

## 2020-11-21 DIAGNOSIS — F4310 Post-traumatic stress disorder, unspecified: Secondary | ICD-10-CM | POA: Diagnosis not present

## 2020-11-21 DIAGNOSIS — F410 Panic disorder [episodic paroxysmal anxiety] without agoraphobia: Secondary | ICD-10-CM | POA: Diagnosis not present

## 2020-11-21 DIAGNOSIS — F341 Dysthymic disorder: Secondary | ICD-10-CM | POA: Diagnosis not present

## 2020-12-20 DIAGNOSIS — F341 Dysthymic disorder: Secondary | ICD-10-CM | POA: Diagnosis not present

## 2020-12-20 DIAGNOSIS — F4310 Post-traumatic stress disorder, unspecified: Secondary | ICD-10-CM | POA: Diagnosis not present

## 2020-12-20 DIAGNOSIS — F3181 Bipolar II disorder: Secondary | ICD-10-CM | POA: Diagnosis not present

## 2020-12-20 DIAGNOSIS — F410 Panic disorder [episodic paroxysmal anxiety] without agoraphobia: Secondary | ICD-10-CM | POA: Diagnosis not present

## 2021-01-09 DIAGNOSIS — F4310 Post-traumatic stress disorder, unspecified: Secondary | ICD-10-CM | POA: Diagnosis not present

## 2021-01-09 DIAGNOSIS — F410 Panic disorder [episodic paroxysmal anxiety] without agoraphobia: Secondary | ICD-10-CM | POA: Diagnosis not present

## 2021-01-09 DIAGNOSIS — F341 Dysthymic disorder: Secondary | ICD-10-CM | POA: Diagnosis not present

## 2021-01-09 DIAGNOSIS — F3181 Bipolar II disorder: Secondary | ICD-10-CM | POA: Diagnosis not present

## 2021-01-18 DIAGNOSIS — R109 Unspecified abdominal pain: Secondary | ICD-10-CM | POA: Diagnosis not present

## 2021-01-18 DIAGNOSIS — Z720 Tobacco use: Secondary | ICD-10-CM | POA: Diagnosis not present

## 2021-01-18 DIAGNOSIS — Z0001 Encounter for general adult medical examination with abnormal findings: Secondary | ICD-10-CM | POA: Diagnosis not present

## 2021-01-18 DIAGNOSIS — L659 Nonscarring hair loss, unspecified: Secondary | ICD-10-CM | POA: Diagnosis not present

## 2021-01-18 DIAGNOSIS — Z8619 Personal history of other infectious and parasitic diseases: Secondary | ICD-10-CM | POA: Diagnosis not present

## 2021-01-18 DIAGNOSIS — Z6823 Body mass index (BMI) 23.0-23.9, adult: Secondary | ICD-10-CM | POA: Diagnosis not present

## 2021-01-18 DIAGNOSIS — Z13220 Encounter for screening for lipoid disorders: Secondary | ICD-10-CM | POA: Diagnosis not present

## 2021-01-22 DIAGNOSIS — F341 Dysthymic disorder: Secondary | ICD-10-CM | POA: Diagnosis not present

## 2021-01-22 DIAGNOSIS — F4310 Post-traumatic stress disorder, unspecified: Secondary | ICD-10-CM | POA: Diagnosis not present

## 2021-01-22 DIAGNOSIS — F410 Panic disorder [episodic paroxysmal anxiety] without agoraphobia: Secondary | ICD-10-CM | POA: Diagnosis not present

## 2021-01-22 DIAGNOSIS — F3181 Bipolar II disorder: Secondary | ICD-10-CM | POA: Diagnosis not present

## 2021-10-03 ENCOUNTER — Ambulatory Visit
Admission: EM | Admit: 2021-10-03 | Discharge: 2021-10-03 | Disposition: A | Discharge status: Home / Self Care | Payer: BLUE CROSS/BLUE SHIELD

## 2021-10-03 DIAGNOSIS — R300 Dysuria: Secondary | ICD-10-CM | POA: Insufficient documentation

## 2021-10-03 DIAGNOSIS — H66002 Acute suppurative otitis media without spontaneous rupture of ear drum, left ear: Secondary | ICD-10-CM | POA: Insufficient documentation

## 2021-10-03 DIAGNOSIS — N898 Other specified noninflammatory disorders of vagina: Secondary | ICD-10-CM

## 2021-10-03 LAB — POCT URINALYSIS DIP (MANUAL ENTRY)
Bilirubin, UA: NEGATIVE
Glucose, UA: NEGATIVE mg/dL
Leukocytes, UA: NEGATIVE
Nitrite, UA: NEGATIVE
Spec Grav, UA: >=1.03 — AB (ref 1.010–1.025)
Urobilinogen, UA: 0.2 E.U./dL
pH, UA: 6.5 (ref 5.0–8.0)

## 2021-10-03 MED ORDER — OFLOXACIN 0.3 % OT SOLN: 10.0000 [drp] | Freq: Every day | OTIC | 0 refills | Status: AC

## 2021-10-03 MED ORDER — AMOXICILLIN-POT CLAVULANATE 875-125 MG PO TABS: 1.0000 | ORAL_TABLET | Freq: Two times a day (BID) | ORAL | 0 refills | Status: AC

## 2021-10-03 NOTE — ED Provider Notes (Signed)
?[facility] ? ? ? ?CSN: [id number] ?Arrival date & time: 10/03/21  1344 ? ? ?  ? ?History   ?Chief Complaint ?Chief Complaint  ?Patient presents with  ? Otalgia  ?  Left  ? ? ?HPI ?Latasha Mitchell is a 26 y.o. female.  ? ?Patient presents with left ear pain and discomfort that has been present for approximately 2 weeks.  Denies any associated upper respiratory symptoms or fever.  Patient reports that she stuck a Q-tip in her ear prior to symptoms starting to try to clean it out.  Denies drainage or decreased hearing.  Patient also reports that she was recently treated for UTI with Bactrim and finished the last pill today.  She had a video visit and was prescribed Bactrim that has not provided any improvement of symptoms.  She also reports a vaginal discharge that started at the end of taking Bactrim.  Denies hematuria, abdominal pain, pelvic pain, fever, back pain.  No concern for pregnancy or STD. ? ? ?Otalgia ? ?Past Medical History:  ?Diagnosis Date  ? Bronchitis   ? Former smoker   ? History of marijuana use   ? Kidney infection   ? Late prenatal care   ? at 24 wks  ? UTI (urinary tract infection)   ? ? ?Patient Active Problem List  ? Diagnosis Date Noted  ? Pregnant and not yet delivered 07/24/2016  ? Hemorrhoids 07/16/2016  ? Supervision of normal pregnancy in second trimester 05/01/2016  ? Marijuana use 10/24/2014  ? Previous cesarean delivery affecting pregnancy, antepartum 10/18/2014  ? ? ?Past Surgical History:  ?Procedure Laterality Date  ? CESAREAN SECTION    ? DILATION AND CURETTAGE OF UTERUS    ? ? ?OB History   ? ? Gravida  ?5  ? Para  ?3  ? Term  ?3  ? Preterm  ?0  ? AB  ?2  ? Living  ?3  ?  ? ? SAB  ?2  ? IAB  ?0  ? Ectopic  ?0  ? Multiple  ?0  ? Live Births  ?3  ?   ?  ?  ? ? ? ?Home Medications   ? ?Prior to Admission medications   ?Medication Sig Start Date End Date Taking? Authorizing Provider  ?amoxicillin-clavulanate (AUGMENTIN) 875-125 MG tablet Take 1 tablet by mouth every 12  (twelve) hours. 10/03/21  Yes Elasha Tess, Acie Fredrickson, FNP  ?ofloxacin (FLOXIN) 0.3 % OTIC solution Place 10 drops into the left ear daily for 7 days. 10/03/21 10/10/21 Yes Gustavus Bryant, FNP  ?venlafaxine (EFFEXOR) 75 MG tablet Take 75 mg by mouth 2 (two) times daily.   Yes [provider]  ?albuterol (PROVENTIL HFA;VENTOLIN HFA) 108 (90 BASE) MCG/ACT inhaler Inhale 2 puffs into the lungs every 6 (six) hours as needed for shortness of breath. 01/03/15   Katrinka Blazing, [place], CNM  ?Cetirizine HCl 10 MG CAPS Take 1 capsule (10 mg total) by mouth daily. 03/28/20   Wieters, Hallie C, PA-C  ?dextromethorphan-guaiFENesin (MUCINEX DM) 30-600 MG 12hr tablet Take 1 tablet by mouth 2 (two) times daily. 03/28/20   Wieters, Hallie C, PA-C  ?diphenhydrAMINE (BENADRYL) 25 mg capsule Take 1 capsule (25 mg total) by mouth every 6 (six) hours as needed for itching. 10/23/17   Derwood Kaplan, MD  ?gabapentin (NEURONTIN) 300 MG capsule 300 capsules. 07/25/21   [provider]  ?lamoTRIgine (LAMICTAL) 200 MG tablet 200 mg. 07/25/21   [provider]  ?Prenatal Vit-Fe Fumarate-FA (PREPLUS)  27-1 MG TABS Take 1 tablet by mouth daily. 05/01/16   Hermina Staggers, MD  ?[facility] SR 150 MG 12 hr tablet 150 mg. 07/25/21   [provider]  ? ? ?Family History ?Family History  ?Problem Relation Age of Onset  ? Heart disease Maternal Grandfather   ? Diabetes Paternal Grandmother   ? Hypertension Paternal Grandmother   ? Stroke Paternal Grandmother   ? Heart disease Paternal Grandfather   ? ? ?Social History ?Social History  ? ?Tobacco Use  ? Smoking status: Former  ?  Types: Cigarettes  ? Smokeless tobacco: Never  ?Vaping Use  ? Vaping Use: Every day  ? Substances: Nicotine, Flavoring  ?Substance Use Topics  ? Alcohol use: No  ? Drug use: Not Currently  ?  Types: Marijuana  ? ? ? ?Allergies   ?Patient has no known allergies. ? ? ?Review of Systems ?Review of Systems ?Per HPI ? ?Physical Exam ?Triage Vital Signs ?ED Triage Vitals  ?Enc  Vitals Group  ?   BP 10/03/21 1407 125/82  ?   Pulse Rate 10/03/21 1407 84  ?   Resp 10/03/21 1407 18  ?   Temp 10/03/21 1407 98.3 ?F (36.8 ?C)  ?   Temp Source 10/03/21 1407 Oral  ?   SpO2 10/03/21 1407 98 %  ?   Weight 10/03/21 1403 160 lb (72.6 kg)  ?   Height 10/03/21 1403 5\' 7"  (1.702 m)  ?   Head Circumference --   ?   Peak Flow --   ?   Pain Score 10/03/21 1403 7  ?   Pain Loc --   ?   Pain Edu? --   ?   Excl. in GC? --   ? ?No data found. ? ?Updated Vital Signs ?BP 125/82 (BP Location: Right Arm)   Pulse 84   Temp 98.3 ?F (36.8 ?C) (Oral)   Resp 18   Ht 5\' 7"  (1.702 m)   Wt 160 lb (72.6 kg)   LMP 09/05/2021   SpO2 98%   BMI 25.06 kg/m?  ? ?Visual Acuity ?Right Eye Distance:   ?Left Eye Distance:   ?Bilateral Distance:   ? ?Right Eye Near:   ?Left Eye Near:    ?Bilateral Near:    ? ?Physical Exam ?Constitutional:   ?   General: She is not in acute distress. ?   Appearance: Normal appearance. She is not toxic-appearing or diaphoretic.  ?HENT:  ?   Head: Normocephalic and atraumatic.  ?   Right Ear: Tympanic membrane, ear canal and external ear normal.  ?   Left Ear: External ear normal. No drainage, swelling or tenderness.  No middle ear effusion. No mastoid tenderness. Tympanic membrane is erythematous. Tympanic membrane is not perforated or bulging.  ?   Ears:  ?   Comments: Slightly erythematous left external canal. ?Eyes:  ?   Extraocular Movements: Extraocular movements intact.  ?   Conjunctiva/sclera: Conjunctivae normal.  ?Cardiovascular:  ?   Rate and Rhythm: Normal rate and regular rhythm.  ?   Pulses: Normal pulses.  ?   Heart sounds: Normal heart sounds.  ?Pulmonary:  ?   Effort: Pulmonary effort is normal. No respiratory distress.  ?   Breath sounds: Normal breath sounds.  ?Genitourinary: ?   Comments: Deferred with shared decision making.  Self swab performed. ?Neurological:  ?   General: No focal deficit present.  ?   Mental Status: She is alert and oriented to person,  place, and time.  Mental status is at baseline.  ?Psychiatric:     ?   Mood and Affect: Mood normal.     ?   Behavior: Behavior normal.     ?   Thought Content: Thought content normal.     ?   Judgment: Judgment normal.  ? ? ? ?UC Treatments / Results  ?Labs ?(all labs ordered are listed, but only abnormal results are displayed) ?Labs Reviewed  ?POCT URINALYSIS DIP (MANUAL ENTRY) - Abnormal; Notable for the following components:  ?    Result Value  ? Ketones, POC UA trace (5) (*)   ? Spec Grav, UA >=1.030 (*)   ? Blood, UA trace-intact (*)   ? Protein Ur, POC trace (*)   ? All other components within normal limits  ?URINE CULTURE  ?CERVICOVAGINAL ANCILLARY ONLY  ? ? ?EKG ? ? ?Radiology ?No results found. ? ?Procedures ?Procedures (including critical care time) ? ?Medications Ordered in UC ?Medications - No data to display ? ?Initial Impression / Assessment and Plan / UC Course  ?I have reviewed the triage vital signs and the nursing notes. ? ?Pertinent labs & imaging results that were available during my care of the patient were reviewed by me and considered in my medical decision making (see chart for details). ? ?  ? ?Patient has left otitis media so will treat with Augmentin antibiotic given recent antibiotic therapy for UTI.  Ofloxacin also prescribed given that external canal was erythematous.  Urinalysis completed that was not definitive for urinary tract infection.  It did show signs of possible decreased fluid intake but no other concerns for any other worrisome etiology at this time.  Urine culture is pending.  Cervicovaginal swab also pending to rule out BV or vaginal yeast as cause of patient's persistent dysuria.  Discussed return precautions.  Patient verbalized understanding and was agreeable with plan. ?Final Clinical Impressions(s) / UC Diagnoses  ? ?Final diagnoses:  ?Non-recurrent acute suppurative otitis media of left ear without spontaneous rupture of tympanic membrane  ?Dysuria  ?Vaginal discharge   ? ? ? ?Discharge Instructions   ? ?  ?You are being treated with antibiotic pills as well as antibiotic eardrops for left ear infection.  Urine culture and vaginal swab are pending.  We will call if results are positive and a

## 2021-10-03 NOTE — ED Triage Notes (Signed)
Patient presents to Urgent Care with complaints of otalgia in L ear that is causing ha, and muffled hearing since last week. Patient reports attempting to clean ear out when it first started and did not retreive anything and reports she thinks it is too swollen to insert q tip. Pt reports otc ear drops. Pt was on antibiotic for uti recently and thought that would help but it did not.   ?

## 2021-10-03 NOTE — Discharge Instructions (Signed)
You are being treated with antibiotic pills as well as antibiotic eardrops for left ear infection.  Urine culture and vaginal swab are pending.  We will call if results are positive and add any treatment if necessary. ?

## 2021-10-04 LAB — URINE CULTURE: Culture: <10000 — AB

## 2021-10-04 LAB — CERVICOVAGINAL ANCILLARY ONLY
Bacterial Vaginitis (gardnerella): NEGATIVE
Candida Glabrata: NEGATIVE
Candida Vaginitis: NEGATIVE
Comment: NEGATIVE
Comment: NEGATIVE
Comment: NEGATIVE

## 2021-10-17 ENCOUNTER — Encounter: Payer: Self-pay | Admitting: Emergency Medicine

## 2021-10-17 ENCOUNTER — Ambulatory Visit
Admission: EM | Admit: 2021-10-17 | Discharge: 2021-10-17 | Disposition: A | Discharge status: Home / Self Care | Payer: Self-pay | Attending: Internal Medicine | Admitting: Internal Medicine

## 2021-10-17 ENCOUNTER — Ambulatory Visit: Payer: Self-pay

## 2021-10-17 DIAGNOSIS — J020 Streptococcal pharyngitis: Secondary | ICD-10-CM

## 2021-10-17 LAB — POCT RAPID STREP A (OFFICE): Rapid Strep A Screen: POSITIVE — AB

## 2021-10-17 MED ORDER — CEFDINIR 300 MG PO CAPS: 300.0000 mg | ORAL_CAPSULE | Freq: Two times a day (BID) | ORAL | 0 refills | Status: AC

## 2021-10-17 NOTE — Discharge Instructions (Signed)
You have strep throat which is being treated with antibiotic.  Please follow-up if symptoms persist or worsen. 

## 2021-10-17 NOTE — ED Triage Notes (Signed)
Patient c/o sore throat and headache x 2 days.  Patient unsure if it's allergies or not.  Patient has been using OTC allergy meds, cough drops and Tylenol. ?

## 2021-10-17 NOTE — ED Provider Notes (Signed)
?[facility] ? ? ? ?CSN: [id number] ?Arrival date & time: 10/17/21  1614 ? ? ?  ? ?History   ?Chief Complaint ?Chief Complaint  ?Patient presents with  ? Sore Throat  ? ? ?HPI ?Latasha Mitchell is a 26 y.o. female.  ? ?Patient presents with sore throat and headache that has been present for 2 days.  Denies any associated upper respiratory symptoms, cough, fever.  Denies any known sick contacts.  Patient has taken over-the-counter allergy medication, cough drops, Tylenol with minimal improvement.  Denies chest pain, shortness of breath, ear pain, nausea, vomiting, diarrhea, abdominal pain.  Patient was recently seen on 10/03/2021 and treated with Augmentin antibiotic for ear infection which she states has improved and she has completed medication. ? ? ?Sore Throat ? ? ?Past Medical History:  ?Diagnosis Date  ? Bronchitis   ? Former smoker   ? History of marijuana use   ? Kidney infection   ? Late prenatal care   ? at 24 wks  ? UTI (urinary tract infection)   ? ? ?Patient Active Problem List  ? Diagnosis Date Noted  ? Pregnant and not yet delivered 07/24/2016  ? Hemorrhoids 07/16/2016  ? Supervision of normal pregnancy in second trimester 05/01/2016  ? Marijuana use 10/24/2014  ? Previous cesarean delivery affecting pregnancy, antepartum 10/18/2014  ? ? ?Past Surgical History:  ?Procedure Laterality Date  ? CESAREAN SECTION    ? DILATION AND CURETTAGE OF UTERUS    ? ? ?OB History   ? ? Gravida  ?5  ? Para  ?3  ? Term  ?3  ? Preterm  ?0  ? AB  ?2  ? Living  ?3  ?  ? ? SAB  ?2  ? IAB  ?0  ? Ectopic  ?0  ? Multiple  ?0  ? Live Births  ?3  ?   ?  ?  ? ? ? ?Home Medications   ? ?Prior to Admission medications   ?Medication Sig Start Date End Date Taking? Authorizing Provider  ?albuterol (PROVENTIL HFA;VENTOLIN HFA) 108 (90 BASE) MCG/ACT inhaler Inhale 2 puffs into the lungs every 6 (six) hours as needed for shortness of breath. 01/03/15  Yes Smith, [place], CNM  ?amoxicillin-clavulanate (AUGMENTIN) 875-125 MG  tablet Take 1 tablet by mouth every 12 (twelve) hours. 10/03/21  Yes Gustavus Bryant, FNP  ?cefdinir (OMNICEF) 300 MG capsule Take 1 capsule (300 mg total) by mouth 2 (two) times daily for 10 days. 10/17/21 10/27/21 Yes Gustavus Bryant, FNP  ?Cetirizine HCl 10 MG CAPS Take 1 capsule (10 mg total) by mouth daily. 03/28/20  Yes Wieters, Hallie C, PA-C  ?dextromethorphan-guaiFENesin (MUCINEX DM) 30-600 MG 12hr tablet Take 1 tablet by mouth 2 (two) times daily. 03/28/20  Yes Wieters, Hallie C, PA-C  ?diphenhydrAMINE (BENADRYL) 25 mg capsule Take 1 capsule (25 mg total) by mouth every 6 (six) hours as needed for itching. 10/23/17  Yes Derwood Kaplan, MD  ?gabapentin (NEURONTIN) 300 MG capsule 300 capsules. 07/25/21  Yes [provider]  ?lamoTRIgine (LAMICTAL) 200 MG tablet 200 mg. 07/25/21  Yes [provider]  ?Prenatal Vit-Fe Fumarate-FA (PREPLUS) 27-1 MG TABS Take 1 tablet by mouth daily. 05/01/16  Yes Hermina Staggers, MD  ?venlafaxine (EFFEXOR) 75 MG tablet Take 75 mg by mouth 2 (two) times daily.   Yes [provider]  ?[facility] SR 150 MG 12 hr tablet 150 mg. 07/25/21  Yes [provider]  ? ? ?Family History ?Family  History  ?Problem Relation Age of Onset  ? Heart disease Maternal Grandfather   ? Diabetes Paternal Grandmother   ? Hypertension Paternal Grandmother   ? Stroke Paternal Grandmother   ? Heart disease Paternal Grandfather   ? ? ?Social History ?Social History  ? ?Tobacco Use  ? Smoking status: Former  ?  Types: Cigarettes  ? Smokeless tobacco: Never  ?Vaping Use  ? Vaping Use: Every day  ? Substances: Nicotine, Flavoring  ?Substance Use Topics  ? Alcohol use: No  ? Drug use: Not Currently  ?  Types: Marijuana  ? ? ? ?Allergies   ?Patient has no known allergies. ? ? ?Review of Systems ?Review of Systems ?Per HPI ? ?Physical Exam ?Triage Vital Signs ?ED Triage Vitals  ?Enc Vitals Group  ?   BP 10/17/21 1652 128/76  ?   Pulse Rate 10/17/21 1652 99  ?   Resp 10/17/21 1652 18  ?   Temp  10/17/21 1652 98.1 ?F (36.7 ?C)  ?   Temp Source 10/17/21 1652 Oral  ?   SpO2 10/17/21 1652 98 %  ?   Weight 10/17/21 1654 160 lb 0.9 oz (72.6 kg)  ?   Height 10/17/21 1654 5\' 7"  (1.702 m)  ?   Head Circumference --   ?   Peak Flow --   ?   Pain Score 10/17/21 1654 4  ?   Pain Loc --   ?   Pain Edu? --   ?   Excl. in GC? --   ? ?No data found. ? ?Updated Vital Signs ?BP 128/76 (BP Location: Right Arm)   Pulse 99   Temp 98.1 ?F (36.7 ?C) (Oral)   Resp 18   Ht 5\' 7"  (1.702 m)   Wt 160 lb 0.9 oz (72.6 kg)   SpO2 98%   BMI 25.07 kg/m?  ? ?Visual Acuity ?Right Eye Distance:   ?Left Eye Distance:   ?Bilateral Distance:   ? ?Right Eye Near:   ?Left Eye Near:    ?Bilateral Near:    ? ?Physical Exam ?Constitutional:   ?   General: She is not in acute distress. ?   Appearance: Normal appearance. She is not toxic-appearing or diaphoretic.  ?HENT:  ?   Head: Normocephalic and atraumatic.  ?   Right Ear: Tympanic membrane and ear canal normal.  ?   Left Ear: Tympanic membrane and ear canal normal.  ?   Nose: Nose normal.  ?   Mouth/Throat:  ?   Mouth: Mucous membranes are moist.  ?   Pharynx: Posterior oropharyngeal erythema present. No pharyngeal swelling, oropharyngeal exudate or uvula swelling.  ?   Tonsils: No tonsillar exudate or tonsillar abscesses.  ?Eyes:  ?   Extraocular Movements: Extraocular movements intact.  ?   Conjunctiva/sclera: Conjunctivae normal.  ?   Pupils: Pupils are equal, round, and reactive to light.  ?Cardiovascular:  ?   Rate and Rhythm: Normal rate and regular rhythm.  ?   Pulses: Normal pulses.  ?   Heart sounds: Normal heart sounds.  ?Pulmonary:  ?   Effort: Pulmonary effort is normal. No respiratory distress.  ?   Breath sounds: Normal breath sounds. No wheezing.  ?Abdominal:  ?   General: Abdomen is flat. Bowel sounds are normal.  ?   Palpations: Abdomen is soft.  ?Musculoskeletal:     ?   General: Normal range of motion.  ?   Cervical back: Normal range of motion.  ?Skin: ?  General: Skin  is warm and dry.  ?Neurological:  ?   General: No focal deficit present.  ?   Mental Status: She is alert and oriented to person, place, and time. Mental status is at baseline.  ?Psychiatric:     ?   Mood and Affect: Mood normal.     ?   Behavior: Behavior normal.  ? ? ? ?UC Treatments / Results  ?Labs ?(all labs ordered are listed, but only abnormal results are displayed) ?Labs Reviewed  ?POCT RAPID STREP A (OFFICE) - Abnormal; Notable for the following components:  ?    Result Value  ? Rapid Strep A Screen Positive (*)   ? All other components within normal limits  ? ? ?EKG ? ? ?Radiology ?No results found. ? ?Procedures ?Procedures (including critical care time) ? ?Medications Ordered in UC ?Medications - No data to display ? ?Initial Impression / Assessment and Plan / UC Course  ?I have reviewed the triage vital signs and the nursing notes. ? ?Pertinent labs & imaging results that were available during my care of the patient were reviewed by me and considered in my medical decision making (see chart for details). ? ?  ? ?Rapid strep was positive.  Will treat with cefdinir antibiotic given that patient was recently treated with Augmentin a few weeks prior.  No signs of peritonsillar abscess on exam.  Patient was also advised that it may be beneficial to take probiotic due to multiple recent antibiotics over the past few weeks as she was treated for UTI as well.  Discussed supportive care and symptom management patient.  Discussed return precautions.  Patient verbalized understanding and was agreeable with plan. ?Final Clinical Impressions(s) / UC Diagnoses  ? ?Final diagnoses:  ?Strep pharyngitis  ? ? ? ?Discharge Instructions   ? ?  ?You have strep throat which is being treated with antibiotic.  Please follow-up if symptoms persist or worsen. ? ? ? ?ED Prescriptions   ? ? Medication Sig Dispense Auth. Provider  ? cefdinir (OMNICEF) 300 MG capsule Take 1 capsule (300 mg total) by mouth 2 (two) times daily for 10  days. 20 capsule Ervin Knack E, [place]  ? ?  ? ?PDMP not reviewed this encounter. ?  ?Gustavus Bryant, [place] ?10/17/21 1726 ? ?

## 2022-02-25 ENCOUNTER — Encounter (HOSPITAL_COMMUNITY): Payer: Self-pay

## 2022-02-25 ENCOUNTER — Emergency Department (HOSPITAL_COMMUNITY): Payer: Commercial Managed Care - HMO

## 2022-02-25 ENCOUNTER — Ambulatory Visit
Admission: EM | Admit: 2022-02-25 | Discharge: 2022-02-25 | Disposition: A | Discharge status: Other Acute Inpatient Hospital | Payer: Commercial Managed Care - HMO

## 2022-02-25 ENCOUNTER — Encounter: Payer: Self-pay | Admitting: Emergency Medicine

## 2022-02-25 ENCOUNTER — Other Ambulatory Visit: Payer: Self-pay

## 2022-02-25 ENCOUNTER — Emergency Department (HOSPITAL_COMMUNITY)
Admission: EM | Admit: 2022-02-25 | Discharge: 2022-02-25 | Disposition: A | Discharge status: Home / Self Care | Payer: Commercial Managed Care - HMO | Attending: Emergency Medicine | Admitting: Emergency Medicine

## 2022-02-25 DIAGNOSIS — Z87891 Personal history of nicotine dependence: Secondary | ICD-10-CM | POA: Diagnosis not present

## 2022-02-25 DIAGNOSIS — R059 Cough, unspecified: Secondary | ICD-10-CM | POA: Diagnosis present

## 2022-02-25 DIAGNOSIS — R042 Hemoptysis: Secondary | ICD-10-CM | POA: Diagnosis not present

## 2022-02-25 DIAGNOSIS — J209 Acute bronchitis, unspecified: Secondary | ICD-10-CM | POA: Diagnosis not present

## 2022-02-25 DIAGNOSIS — U071 COVID-19: Secondary | ICD-10-CM | POA: Insufficient documentation

## 2022-02-25 DIAGNOSIS — J40 Bronchitis, not specified as acute or chronic: Secondary | ICD-10-CM

## 2022-02-25 DIAGNOSIS — R051 Acute cough: Secondary | ICD-10-CM

## 2022-02-25 LAB — CBC WITH DIFFERENTIAL/PLATELET
Abs Immature Granulocytes: 0.02 10*3/uL (ref 0.00–0.07)
Basophils Absolute: 0 10*3/uL (ref 0.0–0.1)
Basophils Relative: 0 %
Eosinophils Absolute: 0.1 10*3/uL (ref 0.0–0.5)
Eosinophils Relative: 1 %
HCT: 39.7 % (ref 36.0–46.0)
Hemoglobin: 13.6 g/dL (ref 12.0–15.0)
Immature Granulocytes: 0 %
Lymphocytes Relative: 6 %
Lymphs Abs: 0.5 10*3/uL — ABNORMAL LOW (ref 0.7–4.0)
MCH: 30 pg (ref 26.0–34.0)
MCHC: 34.3 g/dL (ref 30.0–36.0)
MCV: 87.6 fL (ref 80.0–100.0)
Monocytes Absolute: 0.6 10*3/uL (ref 0.1–1.0)
Monocytes Relative: 8 %
Neutro Abs: 6.5 10*3/uL (ref 1.7–7.7)
Neutrophils Relative %: 85 %
Platelets: 202 10*3/uL (ref 150–400)
RBC: 4.53 MIL/uL (ref 3.87–5.11)
RDW: 12.6 % (ref 11.5–15.5)
WBC: 7.7 10*3/uL (ref 4.0–10.5)
nRBC: 0 % (ref 0.0–0.2)

## 2022-02-25 LAB — RESP PANEL BY RT-PCR (FLU A&B, COVID) ARPGX2
Influenza A by PCR: NEGATIVE
Influenza B by PCR: NEGATIVE
SARS Coronavirus 2 by RT PCR: POSITIVE — AB

## 2022-02-25 LAB — BASIC METABOLIC PANEL
Anion gap: 4 — ABNORMAL LOW (ref 5–15)
BUN: 11 mg/dL (ref 6–20)
CO2: 27 mmol/L (ref 22–32)
Calcium: 9.6 mg/dL (ref 8.9–10.3)
Chloride: 109 mmol/L (ref 98–111)
Creatinine, Ser: 0.75 mg/dL (ref 0.44–1.00)
GFR, Estimated: >60 mL/min (ref >60)
Glucose, Bld: 96 mg/dL (ref 70–99)
Potassium: 3.9 mmol/L (ref 3.5–5.1)
Sodium: 140 mmol/L (ref 135–145)

## 2022-02-25 LAB — I-STAT BETA HCG BLOOD, ED (MC, WL, AP ONLY): I-stat hCG, quantitative: <5 m[IU]/mL (ref <5)

## 2022-02-25 NOTE — ED Provider Triage Note (Signed)
Emergency Medicine Provider Triage Evaluation Note  Latasha Mitchell , a 26 y.o. female  was evaluated in triage.  Pt complains of sore throat onset 2 nights ago. Cough with blood in sputum (has tissue- bright pink color). Taking flu medicin, zyrtec. Heavy vape use.  No known TB exposure. Review of Systems  Positive: Body aches, sore throat Negative: Fevers, chills  Physical Exam  BP 123/72 (BP Location: Right Arm)   Pulse (!) 109   Temp 98.5 F (36.9 C) (Oral)   Resp 18   LMP 02/09/2022 (Approximate)   SpO2 93%  Gen:   Awake, no distress   Resp:  Normal effort  MSK:   Moves extremities without difficulty  Other:    Medical Decision Making  Medically screening exam initiated at 11:52 AM.  Appropriate orders placed.  HAYLEY HORN was informed that the remainder of the evaluation will be completed by another provider, this initial triage assessment does not replace that evaluation, and the importance of remaining in the ED until their evaluation is complete.     Jeannie Fend, PA-C 02/25/22 1153

## 2022-02-25 NOTE — Discharge Instructions (Signed)
Go to the emergency department as soon as you leave urgent care for further evaluation and management. 

## 2022-02-25 NOTE — ED Notes (Signed)
Patient is being discharged from the Urgent Care and sent to the Emergency Department via POV . Per HM, patient is in need of higher level of care due to eval. Patient is aware and verbalizes understanding of plan of care.  Vitals:   02/25/22 0820  BP: 116/75  Pulse: 99  Resp: 18  Temp: 98.8 F (37.1 C)  SpO2: 99%

## 2022-02-25 NOTE — ED Provider Notes (Signed)
[facility]    CSN: [id number] Arrival date & time: 02/25/22  0801      History   Chief Complaint Chief Complaint  Patient presents with   Cough    HPI Latasha Mitchell is a 26 y.o. female.   Patient presents with cough, blood-tinged sputum, throat irritation, generalized body aches that have been present for about 2 days.  Patient reports bright red blood in her sputum.  Denies chest pain, shortness of breath, nausea, vomiting, diarrhea, abdominal pain, sore throat, nasal congestion.  Denies any known sick contacts or fevers.  Patient has not taken any medications to help alleviate symptoms.  Patient denies smoking cigarettes but does report that she vapes.  Patient has picture of blood in sputum on her phone and it is bright red blood.   Cough   Past Medical History:  Diagnosis Date   Bronchitis    Former smoker    History of marijuana use    Kidney infection    Late prenatal care    at 24 wks   UTI (urinary tract infection)     Patient Active Problem List   Diagnosis Date Noted   Pregnant and not yet delivered 07/24/2016   Hemorrhoids 07/16/2016   Supervision of normal pregnancy in second trimester 05/01/2016   Marijuana use 10/24/2014   Previous cesarean delivery affecting pregnancy, antepartum 10/18/2014    Past Surgical History:  Procedure Laterality Date   CESAREAN SECTION     DILATION AND CURETTAGE OF UTERUS      OB History     Gravida  5   Para  3   Term  3   Preterm  0   AB  2   Living  3      SAB  2   IAB  0   Ectopic  0   Multiple  0   Live Births  3            Home Medications    Prior to Admission medications   Medication Sig Start Date End Date Taking? Authorizing Provider  albuterol (PROVENTIL HFA;VENTOLIN HFA) 108 (90 BASE) MCG/ACT inhaler Inhale 2 puffs into the lungs every 6 (six) hours as needed for shortness of breath. 01/03/15   Katrinka Blazing, [place], CNM  amoxicillin-clavulanate (AUGMENTIN) 875-125 MG  tablet Take 1 tablet by mouth every 12 (twelve) hours. 10/03/21   Gustavus Bryant, FNP  Cetirizine HCl 10 MG CAPS Take 1 capsule (10 mg total) by mouth daily. 03/28/20   Wieters, Hallie C, PA-C  dextromethorphan-guaiFENesin (MUCINEX DM) 30-600 MG 12hr tablet Take 1 tablet by mouth 2 (two) times daily. 03/28/20   Wieters, Hallie C, PA-C  diphenhydrAMINE (BENADRYL) 25 mg capsule Take 1 capsule (25 mg total) by mouth every 6 (six) hours as needed for itching. 10/23/17   Derwood Kaplan, MD  gabapentin (NEURONTIN) 300 MG capsule 300 capsules. 07/25/21   [provider]  lamoTRIgine (LAMICTAL) 200 MG tablet 200 mg. 07/25/21   [provider]  Prenatal Vit-Fe Fumarate-FA (PREPLUS) 27-1 MG TABS Take 1 tablet by mouth daily. 05/01/16   Hermina Staggers, MD  venlafaxine (EFFEXOR) 75 MG tablet Take 75 mg by mouth 2 (two) times daily.    [provider]  [facility] SR 150 MG 12 hr tablet 150 mg. 07/25/21   [provider]    Family History Family History  Problem Relation Age of Onset   Heart disease Maternal Grandfather    Diabetes Paternal Grandmother  Hypertension Paternal Grandmother    Stroke Paternal Grandmother    Heart disease Paternal Grandfather     Social History Social History   Tobacco Use   Smoking status: Former    Types: Cigarettes   Smokeless tobacco: Never  Vaping Use   Vaping Use: Every day   Substances: Nicotine, Flavoring  Substance Use Topics   Alcohol use: No   Drug use: Not Currently    Types: Marijuana     Allergies   Patient has no known allergies.   Review of Systems Review of Systems Per HPI  Physical Exam Triage Vital Signs ED Triage Vitals [02/25/22 0820]  Enc Vitals Group     BP 116/75     Pulse Rate 99     Resp 18     Temp 98.8 F (37.1 C)     Temp Source Oral     SpO2 99 %     Weight      Height      Head Circumference      Peak Flow      Pain Score 4     Pain Loc      Pain Edu?      Excl. in GC?    No  data found.  Updated Vital Signs BP 116/75 (BP Location: Left Arm)   Pulse 99   Temp 98.8 F (37.1 C) (Oral)   Resp 18   SpO2 99%   Visual Acuity Right Eye Distance:   Left Eye Distance:   Bilateral Distance:    Right Eye Near:   Left Eye Near:    Bilateral Near:     Physical Exam Constitutional:      General: She is not in acute distress.    Appearance: Normal appearance. She is not toxic-appearing or diaphoretic.  HENT:     Head: Normocephalic and atraumatic.     Right Ear: Tympanic membrane and ear canal normal.     Left Ear: Tympanic membrane and ear canal normal.     Nose: Congestion present.     Mouth/Throat:     Mouth: Mucous membranes are moist.     Pharynx: Posterior oropharyngeal erythema present.  Eyes:     Extraocular Movements: Extraocular movements intact.     Conjunctiva/sclera: Conjunctivae normal.     Pupils: Pupils are equal, round, and reactive to light.  Cardiovascular:     Rate and Rhythm: Normal rate and regular rhythm.     Pulses: Normal pulses.     Heart sounds: Normal heart sounds.  Pulmonary:     Effort: Pulmonary effort is normal. No respiratory distress.     Breath sounds: Normal breath sounds. No stridor. No wheezing, rhonchi or rales.  Abdominal:     General: Abdomen is flat. Bowel sounds are normal.     Palpations: Abdomen is soft.  Musculoskeletal:        General: Normal range of motion.     Cervical back: Normal range of motion.  Skin:    General: Skin is warm and dry.  Neurological:     General: No focal deficit present.     Mental Status: She is alert and oriented to person, place, and time. Mental status is at baseline.  Psychiatric:        Mood and Affect: Mood normal.        Behavior: Behavior normal.      UC Treatments / Results  Labs (all labs ordered are listed, but only abnormal results are displayed)  Labs Reviewed - No data to display  EKG   Radiology No results found.  Procedures Procedures (including  critical care time)  Medications Ordered in UC Medications - No data to display  Initial Impression / Assessment and Plan / UC Course  I have reviewed the triage vital signs and the nursing notes.  Pertinent labs & imaging results that were available during my care of the patient were reviewed by me and considered in my medical decision making (see chart for details).     Given that patient is having hemoptysis, I do believe the patient needs more evaluation than can be provided here in urgent care.  Advised patient to go to the emergency department for further evaluation and management.  Patient was agreeable with plan.  Vital signs and patient stable at discharge.  Agree with patient self transport to the hospital. Final Clinical Impressions(s) / UC Diagnoses   Final diagnoses:  Hemoptysis  Acute cough     Discharge Instructions      Go to the emergency department as soon as you leave urgent care for further evaluation and management.    ED Prescriptions   None    PDMP not reviewed this encounter.   Gustavus Bryant, [place] 02/25/22 [phone]

## 2022-02-25 NOTE — ED Provider Notes (Signed)
[facility]-EMERGENCY DEPT Provider Note  CSN: [id number] Arrival date & time: 02/25/22 1104  Chief Complaint(s) Hemoptysis  HPI Latasha Mitchell is a 26 y.o. female without significant past medical history presenting to the emergency department with cough.  Patient reports cough for the past 2 days with associated sore throat, body aches.  No nausea, vomiting, chest pain, fevers or chills.  Reports son is sick with similar symptoms.  Had a few episodes of streaks of blood in her sputum.  This resolved, no episodes today.  No syncope, fainting.  No leg swelling.  No recent travel or surgeries.   Past Medical History Past Medical History:  Diagnosis Date   Bronchitis    Former smoker    History of marijuana use    Kidney infection    Late prenatal care    at 24 wks   UTI (urinary tract infection)    Patient Active Problem List   Diagnosis Date Noted   Pregnant and not yet delivered 07/24/2016   Hemorrhoids 07/16/2016   Supervision of normal pregnancy in second trimester 05/01/2016   Marijuana use 10/24/2014   Previous cesarean delivery affecting pregnancy, antepartum 10/18/2014   Home Medication(s) Prior to Admission medications   Medication Sig Start Date End Date Taking? Authorizing Provider  albuterol (PROVENTIL HFA;VENTOLIN HFA) 108 (90 BASE) MCG/ACT inhaler Inhale 2 puffs into the lungs every 6 (six) hours as needed for shortness of breath. 01/03/15   Katrinka Blazing, [place], CNM  amoxicillin-clavulanate (AUGMENTIN) 875-125 MG tablet Take 1 tablet by mouth every 12 (twelve) hours. 10/03/21   Gustavus Bryant, FNP  Cetirizine HCl 10 MG CAPS Take 1 capsule (10 mg total) by mouth daily. 03/28/20   Wieters, Hallie C, PA-C  dextromethorphan-guaiFENesin (MUCINEX DM) 30-600 MG 12hr tablet Take 1 tablet by mouth 2 (two) times daily. 03/28/20   Wieters, Hallie C, PA-C  diphenhydrAMINE (BENADRYL) 25 mg capsule Take 1 capsule (25 mg total) by mouth every 6 (six) hours as needed  for itching. 10/23/17   Derwood Kaplan, MD  gabapentin (NEURONTIN) 300 MG capsule 300 capsules. 07/25/21   [provider]  lamoTRIgine (LAMICTAL) 200 MG tablet 200 mg. 07/25/21   [provider]  Prenatal Vit-Fe Fumarate-FA (PREPLUS) 27-1 MG TABS Take 1 tablet by mouth daily. 05/01/16   Hermina Staggers, MD  venlafaxine (EFFEXOR) 75 MG tablet Take 75 mg by mouth 2 (two) times daily.    [provider]  [facility] SR 150 MG 12 hr tablet 150 mg. 07/25/21   [provider]                                                                                                                                    Past Surgical History Past Surgical History:  Procedure Laterality Date   CESAREAN SECTION     DILATION AND CURETTAGE OF UTERUS     Family History Family History  Problem Relation Age of Onset   Heart disease Maternal Grandfather    Diabetes Paternal Grandmother    Hypertension Paternal Grandmother    Stroke Paternal Grandmother    Heart disease Paternal Grandfather     Social History Social History   Tobacco Use   Smoking status: Former    Types: Cigarettes   Smokeless tobacco: Never  Vaping Use   Vaping Use: Every day   Substances: Nicotine, Flavoring  Substance Use Topics   Alcohol use: No   Drug use: Not Currently    Types: Marijuana   Allergies Patient has no known allergies.  Review of Systems Review of Systems  All other systems reviewed and are negative.   Physical Exam Vital Signs  I have reviewed the triage vital signs BP (!) 124/49 (BP Location: Left Arm)   Pulse 82   Temp 99 F (37.2 C) (Oral)   Resp 18   LMP 02/09/2022 (Approximate)   SpO2 100%  Physical Exam Vitals and nursing note reviewed.  Constitutional:      General: She is not in acute distress.    Appearance: She is well-developed.  HENT:     Head: Normocephalic and atraumatic.     Mouth/Throat:     Mouth: Mucous membranes are moist.  Eyes:     Pupils:  Pupils are equal, round, and reactive to light.  Cardiovascular:     Rate and Rhythm: Normal rate and regular rhythm.     Heart sounds: No murmur heard. Pulmonary:     Effort: Pulmonary effort is normal. No respiratory distress.     Breath sounds: Normal breath sounds.  Abdominal:     General: Abdomen is flat.     Palpations: Abdomen is soft.     Tenderness: There is no abdominal tenderness.  Musculoskeletal:        General: No tenderness.     Right lower leg: No edema.     Left lower leg: No edema.  Skin:    General: Skin is warm and dry.  Neurological:     General: No focal deficit present.     Mental Status: She is alert. Mental status is at baseline.  Psychiatric:        Mood and Affect: Mood normal.        Behavior: Behavior normal.     ED Results and Treatments Labs (all labs ordered are listed, but only abnormal results are displayed) Labs Reviewed  RESP PANEL BY RT-PCR (FLU A&B, COVID) ARPGX2 - Abnormal; Notable for the following components:      Result Value   SARS Coronavirus 2 by RT PCR POSITIVE (*)    All other components within normal limits  CBC WITH DIFFERENTIAL/PLATELET - Abnormal; Notable for the following components:   Lymphs Abs 0.5 (*)    All other components within normal limits  BASIC METABOLIC PANEL - Abnormal; Notable for the following components:   Anion gap 4 (*)    All other components within normal limits  I-STAT BETA HCG BLOOD, ED ([facility], [facility], [facility] ONLY)  Radiology DG Chest 2 View  Result Date: 02/25/2022 CLINICAL DATA:  Cough and hemoptysis. EXAM: CHEST - 2 VIEW COMPARISON:  The lungs are clear without focal pneumonia, edema, pneumothorax or pleural effusion. The cardiopericardial silhouette is within normal limits for size. The visualized bony structures of the thorax are unremarkable. FINDINGS: The heart size and mediastinal  contours are within normal limits. Both lungs are clear. The visualized skeletal structures are unremarkable. IMPRESSION: No active cardiopulmonary disease. Electronically Signed   By: Misty Stanley M.D.   On: 02/25/2022 12:37    Pertinent labs & imaging results that were available during my care of the patient were reviewed by me and considered in my medical decision making (see MDM for details).  Medications Ordered in ED Medications - No data to display                                                                                                                                   Procedures Procedures  (including critical care time)  Medical Decision Making / ED Course   MDM:  26 year old female presenting to the emergency department with cough.  Patient overall well-appearing, vital signs reassuring.  Labs reassuring including normal hemoglobin.  Suspect symptoms are bronchitis due to COVID infection.  COVID-positive in the emergency department.  No hypoxia.  Chest x-ray clear.  No large volume hemoptysis to suggest pulmonary infarction, PE, bronchiectasis, tumor.  No signs of CHF.  Will discharge patient to home. All questions answered. Patient comfortable with plan of discharge. Return precautions discussed with patient and specified on the after visit summary.       Additional history obtained: -Additional history obtained from family -External records from outside source obtained and reviewed including: Chart review including previous notes, labs, imaging, consultation notes   Lab Tests: -I ordered, reviewed, and interpreted labs.   The pertinent results include:   Labs Reviewed  RESP PANEL BY RT-PCR (FLU A&B, COVID) ARPGX2 - Abnormal; Notable for the following components:      Result Value   SARS Coronavirus 2 by RT PCR POSITIVE (*)    All other components within normal limits  CBC WITH DIFFERENTIAL/PLATELET - Abnormal; Notable for the following components:   Lymphs  Abs 0.5 (*)    All other components within normal limits  BASIC METABOLIC PANEL - Abnormal; Notable for the following components:   Anion gap 4 (*)    All other components within normal limits  I-STAT BETA HCG BLOOD, ED ([facility], [facility], [facility] ONLY)      EKG   EKG Interpretation  Date/Time:    Ventricular Rate:    PR Interval:    QRS Duration:   QT Interval:    QTC Calculation:   R Axis:     Text Interpretation:           Imaging Studies ordered: I ordered imaging studies including CXR On my interpretation imaging demonstrates clear  lungs I independently visualized and interpreted imaging. I agree with the radiologist interpretation   Medicines ordered and prescription drug management: No orders of the defined types were placed in this encounter.   -I have reviewed the patients home medicines and have made adjustments as needed   Cardiac Monitoring: The patient was maintained on a cardiac monitor.  I personally viewed and interpreted the cardiac monitored which showed an underlying rhythm of: NSR  Social Determinants of Health:  Factors impacting patients care include: former smoker   Reevaluation: After the interventions noted above, I reevaluated the patient and found that they have improved  Co morbidities that complicate the patient evaluation  Past Medical History:  Diagnosis Date   Bronchitis    Former smoker    History of marijuana use    Kidney infection    Late prenatal care    at 24 wks   UTI (urinary tract infection)       Dispostion: Discharge    Final Clinical Impression(s) / ED Diagnoses Final diagnoses:  Bronchitis     This chart was dictated using voice recognition software.  Despite best efforts to proofread,  errors can occur which can change the documentation meaning.    Lonell Grandchild, MD 02/25/22 [id number]

## 2022-02-25 NOTE — ED Triage Notes (Signed)
Pt presents with c/o cough and body aches for several days. Pt reports that she has been coughing up blood-tinged sputum.

## 2022-02-25 NOTE — ED Triage Notes (Signed)
Pt here for  cough and congestion with blood tinged sputum x 2 days; pt sts body aches also

## 2022-02-25 NOTE — Discharge Instructions (Addendum)
We evaluated you in the emergency department for the blood streaks from coughing.  Your chest x-ray was normal.  Your hemoglobin (red blood cells) was normal.  There is no sign of blood loss.  Your symptoms are most likely due to viral bronchitis.  Your symptoms should improve with time.  Please take Tylenol and Motrin for your symptoms at home.  You can take 650 mg of Tylenol every 6 hours and 600 mg of ibuprofen every 6 hours as needed for your symptoms.  You can take these medicines together as needed, either at the same time, or alternating every 3 hours.  Please return to the emergency department if you develop any new or worsening symptoms such as trouble breathing, large volume of bloody sputum, severe pain, or any other concerning symptoms.

## 2023-05-03 ENCOUNTER — Ambulatory Visit
Admission: EM | Admit: 2023-05-03 | Discharge: 2023-05-03 | Disposition: A | Discharge status: Home / Self Care | Payer: Medicaid Other | Attending: Internal Medicine | Admitting: Internal Medicine

## 2023-05-03 ENCOUNTER — Ambulatory Visit: Payer: Medicaid Other

## 2023-05-03 DIAGNOSIS — R059 Cough, unspecified: Secondary | ICD-10-CM | POA: Diagnosis not present

## 2023-05-03 DIAGNOSIS — R5383 Other fatigue: Secondary | ICD-10-CM

## 2023-05-03 DIAGNOSIS — J209 Acute bronchitis, unspecified: Secondary | ICD-10-CM | POA: Diagnosis not present

## 2023-05-03 NOTE — Discharge Instructions (Signed)
Please maintain adequate hydration Please take medications as directed Your chest x-ray is negative for pneumonia Will call you with recommendations if labs are abnormal Please feel free to return to urgent care if you have worsening symptoms.

## 2023-05-03 NOTE — ED Provider Notes (Signed)
[facility]    CSN: [id number] Arrival date & time: 05/03/23  1310      History   Chief Complaint Chief Complaint  Patient presents with   Back Pain   Cough   Chills   Fatigue    HPI Latasha Mitchell is a 27 y.o. female.   HPI  Past Medical History:  Diagnosis Date   Bronchitis    Former smoker    History of marijuana use    Kidney infection    Late prenatal care    at 24 wks   UTI (urinary tract infection)     Patient Active Problem List   Diagnosis Date Noted   Pregnant and not yet delivered 07/24/2016   Hemorrhoids 07/16/2016   Supervision of normal pregnancy in second trimester 05/01/2016   Marijuana use 10/24/2014   Previous cesarean delivery affecting pregnancy, antepartum 10/18/2014    Past Surgical History:  Procedure Laterality Date   CESAREAN SECTION     DILATION AND CURETTAGE OF UTERUS      OB History     Gravida  5   Para  3   Term  3   Preterm  0   AB  2   Living  3      SAB  2   IAB  0   Ectopic  0   Multiple  0   Live Births  3            Home Medications    Prior to Admission medications   Medication Sig Start Date End Date Taking? Authorizing Provider  amphetamine-dextroamphetamine (ADDERALL XR) 30 MG 24 hr capsule Take 30 mg by mouth every morning. 02/23/23  Yes [provider]  amphetamine-dextroamphetamine (ADDERALL) 15 MG tablet Take 20 mg by mouth daily. 02/23/23  Yes [provider]  azithromycin (ZITHROMAX) 250 MG tablet Take 250 mg by mouth as directed. 11/30/22  Yes [provider]  cyclobenzaprine (FLEXERIL) 5 MG tablet Take 5 mg by mouth 3 (three) times daily as needed. 11/30/22  Yes [provider]  ibuprofen (ADVIL) 600 MG tablet Take 600 mg by mouth every 6 (six) hours as needed. 04/05/16  Yes [provider]  methylPREDNISolone (MEDROL DOSEPAK) 4 MG TBPK tablet Take 4 mg by mouth as directed. 11/30/22  Yes [provider]  albuterol  (PROVENTIL HFA;VENTOLIN HFA) 108 (90 BASE) MCG/ACT inhaler Inhale 2 puffs into the lungs every 6 (six) hours as needed for shortness of breath. 01/03/15   Katrinka Blazing, [place], CNM  amoxicillin-clavulanate (AUGMENTIN) 875-125 MG tablet Take 1 tablet by mouth every 12 (twelve) hours. 10/03/21   Gustavus Bryant, FNP  Cetirizine HCl 10 MG CAPS Take 1 capsule (10 mg total) by mouth daily. 03/28/20   Wieters, Hallie C, PA-C  dextromethorphan-guaiFENesin (MUCINEX DM) 30-600 MG 12hr tablet Take 1 tablet by mouth 2 (two) times daily. 03/28/20   Wieters, Hallie C, PA-C  diphenhydrAMINE (BENADRYL) 25 mg capsule Take 1 capsule (25 mg total) by mouth every 6 (six) hours as needed for itching. 10/23/17   Derwood Kaplan, MD  gabapentin (NEURONTIN) 300 MG capsule 300 capsules. 07/25/21   [provider]  lamoTRIgine (LAMICTAL) 200 MG tablet 200 mg. 07/25/21   [provider]  Prenatal Vit-Fe Fumarate-FA (PREPLUS) 27-1 MG TABS Take 1 tablet by mouth daily. 05/01/16   Hermina Staggers, MD  venlafaxine (EFFEXOR) 75 MG tablet Take 75 mg by mouth 2 (two) times daily.    [provider]  WELLBUTRIN SR 150 MG 12 hr tablet 150 mg. 07/25/21   [provider]    Family History Family History  Problem Relation Age of Onset   Heart disease Maternal Grandfather    Diabetes Paternal Grandmother    Hypertension Paternal Grandmother    Stroke Paternal Grandmother    Heart disease Paternal Grandfather     Social History Social History   Tobacco Use   Smoking status: Former    Types: Cigarettes   Smokeless tobacco: Never  Vaping Use   Vaping status: Every Day   Substances: Nicotine, Flavoring  Substance Use Topics   Alcohol use: No   Drug use: Not Currently    Types: Marijuana     Allergies   Patient has no known allergies.   Review of Systems Review of Systems   Physical Exam Triage Vital Signs ED Triage Vitals  Encounter Vitals Group     BP 05/03/23 1341 104/64     Systolic BP  Percentile --      Diastolic BP Percentile --      Pulse Rate 05/03/23 1341 71     Resp 05/03/23 1341 16     Temp 05/03/23 1341 97.8 F (36.6 C)     Temp Source 05/03/23 1341 Oral     SpO2 05/03/23 1341 97 %     Weight 05/03/23 1338 130 lb (59 kg)     Height 05/03/23 1338 5\' 7"  (1.702 m)     Head Circumference --      Peak Flow --      Pain Score 05/03/23 1334 0     Pain Loc --      Pain Education --      Exclude from Growth Chart --    No data found.  Updated Vital Signs BP 104/64 (BP Location: Left Arm)   Pulse 71   Temp 97.8 F (36.6 C) (Oral)   Resp 16   Ht 5\' 7"  (1.702 m)   Wt 59 kg   LMP 03/30/2023 (Exact Date)   SpO2 97%   BMI 20.36 kg/m   Visual Acuity Right Eye Distance:   Left Eye Distance:   Bilateral Distance:    Right Eye Near:   Left Eye Near:    Bilateral Near:     Physical Exam   UC Treatments / Results  Labs (all labs ordered are listed, but only abnormal results are displayed) Labs Reviewed - No data to display  EKG   Radiology No results found.  Procedures Procedures (including critical care time)  Medications Ordered in UC Medications - No data to display  Initial Impression / Assessment and Plan / UC Course  I have reviewed the triage vital signs and the nursing notes.  Pertinent labs & imaging results that were available during my care of the patient were reviewed by me and considered in my medical decision making (see chart for details).     *** Final Clinical Impressions(s) / UC Diagnoses   Final diagnoses:  None   Discharge Instructions   None    ED Prescriptions   None    PDMP not reviewed this encounter.

## 2023-05-03 NOTE — ED Triage Notes (Signed)
"  I have been experiencing something now for a while starting around 10-21, fatigue, nausea, back pain (history of pulled muscle/pain), cough (intermittent), flushed at times, multiple issues to check". "My son just got diagnosed with Pneumonia". Negative for COVID19 and Flu A/B. Stools recently loose. Voids "fine/normal".

## 2023-05-04 LAB — CBC WITH DIFFERENTIAL/PLATELET
Basophils Absolute: 0 10*3/uL (ref 0.0–0.2)
Basos: 1 %
EOS (ABSOLUTE): 0.1 10*3/uL (ref 0.0–0.4)
Eos: 2 %
Hematocrit: 42.7 % (ref 34.0–46.6)
Hemoglobin: 14.2 g/dL (ref 11.1–15.9)
Immature Grans (Abs): 0 10*3/uL (ref 0.0–0.1)
Immature Granulocytes: 0 %
Lymphocytes Absolute: 2 10*3/uL (ref 0.7–3.1)
Lymphs: 30 %
MCH: 29.5 pg (ref 26.6–33.0)
MCHC: 33.3 g/dL (ref 31.5–35.7)
MCV: 89 fL (ref 79–97)
Monocytes Absolute: 0.4 10*3/uL (ref 0.1–0.9)
Monocytes: 6 %
Neutrophils Absolute: 4.1 10*3/uL (ref 1.4–7.0)
Neutrophils: 61 %
Platelets: 260 10*3/uL (ref 150–450)
RBC: 4.82 x10E6/uL (ref 3.77–5.28)
RDW: 12.1 % (ref 11.7–15.4)
WBC: 6.7 10*3/uL (ref 3.4–10.8)

## 2023-05-05 LAB — BASIC METABOLIC PANEL
BUN/Creatinine Ratio: 15 (ref 9–23)
BUN: 11 mg/dL (ref 6–20)
CO2: 23 mmol/L (ref 20–29)
Calcium: 9.4 mg/dL (ref 8.7–10.2)
Chloride: 104 mmol/L (ref 96–106)
Creatinine, Ser: 0.71 mg/dL (ref 0.57–1.00)
Glucose: 96 mg/dL (ref 70–99)
Potassium: 3.8 mmol/L (ref 3.5–5.2)
Sodium: 139 mmol/L (ref 134–144)
eGFR: 119 mL/min/{1.73_m2} (ref >59)

## 2023-09-07 ENCOUNTER — Ambulatory Visit
Admission: EM | Admit: 2023-09-07 | Discharge: 2023-09-07 | Disposition: A | Discharge status: Home / Self Care | Payer: Self-pay

## 2023-09-07 DIAGNOSIS — J302 Other seasonal allergic rhinitis: Secondary | ICD-10-CM

## 2023-09-07 DIAGNOSIS — Z720 Tobacco use: Secondary | ICD-10-CM

## 2023-09-07 DIAGNOSIS — H6503 Acute serous otitis media, bilateral: Secondary | ICD-10-CM

## 2023-09-07 NOTE — Discharge Instructions (Signed)
 Take daily allergy med of choice(claritin,zyrtec,allegra,etc) Use over the counter flonase as label directed Stop vaping Follow up with PCP/ENT if symptoms persist Return as needed

## 2023-09-07 NOTE — ED Provider Notes (Signed)
 [facility]    CSN: [id number] Arrival date & time: 09/07/23  0850      History   Chief Complaint Chief Complaint  Patient presents with   Otalgia    HPI Latasha Mitchell is a 28 y.o. female.   28 year old female, Latasha Mitchell, presents to urgent care for evaluation of bilateral ear discomfort" fluid/fullness" in both ears for several weeks.  Patient denies any pain when swallowing, patient endorses vaping.  The history is provided by the patient. No language interpreter was used.    Past Medical History:  Diagnosis Date   Bronchitis    Former smoker    History of marijuana use    Kidney infection    Late prenatal care    at 24 wks   UTI (urinary tract infection)     Patient Active Problem List   Diagnosis Date Noted   Bilateral acute serous otitis media 09/07/2023   Seasonal allergies 09/07/2023   Vapes nicotine containing substance 09/07/2023   Pregnant and not yet delivered 07/24/2016   Hemorrhoids 07/16/2016   Supervision of normal pregnancy in second trimester 05/01/2016   Marijuana use 10/24/2014   Previous cesarean delivery affecting pregnancy, antepartum 10/18/2014    Past Surgical History:  Procedure Laterality Date   CESAREAN SECTION     DILATION AND CURETTAGE OF UTERUS      OB History     Gravida  5   Para  3   Term  3   Preterm  0   AB  2   Living  3      SAB  2   IAB  0   Ectopic  0   Multiple  0   Live Births  3            Home Medications    Prior to Admission medications   Medication Sig Start Date End Date Taking? Authorizing Provider  albuterol (PROVENTIL HFA;VENTOLIN HFA) 108 (90 BASE) MCG/ACT inhaler Inhale 2 puffs into the lungs every 6 (six) hours as needed for shortness of breath. 01/03/15   Katrinka Blazing, [place], CNM  amoxicillin-clavulanate (AUGMENTIN) 875-125 MG tablet Take 1 tablet by mouth every 12 (twelve) hours. 10/03/21   Gustavus Bryant, FNP  amphetamine-dextroamphetamine (ADDERALL XR) 30  MG 24 hr capsule Take 30 mg by mouth every morning. 02/23/23   [provider]  amphetamine-dextroamphetamine (ADDERALL) 15 MG tablet Take 20 mg by mouth daily. 02/23/23   [provider]  azithromycin (ZITHROMAX) 250 MG tablet Take 250 mg by mouth as directed. 11/30/22   [provider]  Cetirizine HCl 10 MG CAPS Take 1 capsule (10 mg total) by mouth daily. 03/28/20   Wieters, Hallie C, PA-C  cyclobenzaprine (FLEXERIL) 5 MG tablet Take 5 mg by mouth 3 (three) times daily as needed. 11/30/22   [provider]  dextromethorphan-guaiFENesin (MUCINEX DM) 30-600 MG 12hr tablet Take 1 tablet by mouth 2 (two) times daily. 03/28/20   Wieters, Hallie C, PA-C  diphenhydrAMINE (BENADRYL) 25 mg capsule Take 1 capsule (25 mg total) by mouth every 6 (six) hours as needed for itching. 10/23/17   Derwood Kaplan, MD  gabapentin (NEURONTIN) 300 MG capsule 300 capsules. 07/25/21   [provider]  ibuprofen (ADVIL) 600 MG tablet Take 600 mg by mouth every 6 (six) hours as needed. 04/05/16   [provider]  lamoTRIgine (LAMICTAL) 200 MG tablet 200 mg. 07/25/21   [provider]  methylPREDNISolone (MEDROL DOSEPAK) 4 MG  TBPK tablet Take 4 mg by mouth as directed. 11/30/22   [provider]  Prenatal Vit-Fe Fumarate-FA (PREPLUS) 27-1 MG TABS Take 1 tablet by mouth daily. 05/01/16   Hermina Staggers, MD  venlafaxine (EFFEXOR) 75 MG tablet Take 75 mg by mouth 2 (two) times daily.    [provider]  [facility] SR 150 MG 12 hr tablet 150 mg. 07/25/21   [provider]    Family History Family History  Problem Relation Age of Onset   Heart disease Maternal Grandfather    Diabetes Paternal Grandmother    Hypertension Paternal Grandmother    Stroke Paternal Grandmother    Heart disease Paternal Grandfather     Social History Social History   Tobacco Use   Smoking status: Former    Types: Cigarettes   Smokeless tobacco: Never  Vaping Use    Vaping status: Every Day   Substances: Nicotine, Flavoring  Substance Use Topics   Alcohol use: No   Drug use: Not Currently    Types: Marijuana     Allergies   Patient has no known allergies.   Review of Systems Review of Systems  Constitutional:  Negative for fever.  HENT:  Positive for ear pain and sinus pressure. Negative for congestion and ear discharge.   Respiratory:  Negative for cough.   Gastrointestinal:  Negative for nausea and vomiting.  All other systems reviewed and are negative.    Physical Exam Triage Vital Signs ED Triage Vitals  Encounter Vitals Group     BP 09/07/23 1013 116/73     Systolic BP Percentile --      Diastolic BP Percentile --      Pulse Rate 09/07/23 1013 65     Resp 09/07/23 1013 18     Temp 09/07/23 1013 98 F (36.7 C)     Temp Source 09/07/23 1013 Oral     SpO2 09/07/23 1013 97 %     Weight --      Height --      Head Circumference --      Peak Flow --      Pain Score 09/07/23 1010 4     Pain Loc --      Pain Education --      Exclude from Growth Chart --    No data found.  Updated Vital Signs BP 116/73 (BP Location: Left Arm)   Pulse 65   Temp 98 F (36.7 C) (Oral)   Resp 18   LMP 08/07/2023 (Exact Date)   SpO2 97%   Visual Acuity Right Eye Distance:   Left Eye Distance:   Bilateral Distance:    Right Eye Near:   Left Eye Near:    Bilateral Near:     Physical Exam Vitals and nursing note reviewed.  Constitutional:      General: She is not in acute distress.    Appearance: She is well-developed and well-groomed.  HENT:     Head: Normocephalic.     Right Ear: Ear canal and external ear normal. A middle ear effusion is present. Tympanic membrane is retracted.     Left Ear: Ear canal and external ear normal. A middle ear effusion is present. Tympanic membrane is retracted.     Nose: Mucosal edema and congestion present.     Right Sinus: No maxillary sinus tenderness or frontal sinus tenderness.     Left Sinus:  No maxillary sinus tenderness or frontal sinus tenderness.     Mouth/Throat:  Lips: Pink.     Mouth: Mucous membranes are moist.     Pharynx: Oropharynx is clear. Uvula midline.  Eyes:     General: Lids are normal.     Conjunctiva/sclera: Conjunctivae normal.     Pupils: Pupils are equal, round, and reactive to light.  Neck:     Trachea: No tracheal deviation.  Cardiovascular:     Rate and Rhythm: Normal rate and regular rhythm.     Pulses: Normal pulses.     Heart sounds: Normal heart sounds. No murmur heard. Pulmonary:     Effort: Pulmonary effort is normal.     Breath sounds: Normal breath sounds and air entry.  Abdominal:     General: Bowel sounds are normal.     Palpations: Abdomen is soft.     Tenderness: There is no abdominal tenderness.  Musculoskeletal:        General: Normal range of motion.     Cervical back: Normal range of motion.  Lymphadenopathy:     Cervical: No cervical adenopathy.  Skin:    General: Skin is warm and dry.     Findings: No rash.  Neurological:     General: No focal deficit present.     Mental Status: She is alert and oriented to person, place, and time.     GCS: GCS eye subscore is 4. GCS verbal subscore is 5. GCS motor subscore is 6.  Psychiatric:        Attention and Perception: Attention normal.        Mood and Affect: Mood normal.        Speech: Speech normal.        Behavior: Behavior normal. Behavior is cooperative.      UC Treatments / Results  Labs (all labs ordered are listed, but only abnormal results are displayed) Labs Reviewed - No data to display  EKG   Radiology No results found.  Procedures Procedures (including critical care time)  Medications Ordered in UC Medications - No data to display  Initial Impression / Assessment and Plan / UC Course  I have reviewed the triage vital signs and the nursing notes.  Pertinent labs & imaging results that were available during my care of the patient were reviewed  by me and considered in my medical decision making (see chart for details).    Discussed exam findings and plan of care with patient, ENT referral given ,use OTC meds for symptoms management, strict go to ER precautions given.   Patient verbalized understanding to this provider.  Ddx: Bilateral acute serous otitis media, seasonal allergies, vapes nicotine Final Clinical Impressions(s) / UC Diagnoses   Final diagnoses:  Bilateral acute serous otitis media, recurrence not specified  Seasonal allergies  Vapes nicotine containing substance     Discharge Instructions      Take daily allergy med of choice(claritin,zyrtec,allegra,etc) Use over the counter flonase as label directed Stop vaping Follow up with PCP/ENT if symptoms persist Return as needed     ED Prescriptions   None    PDMP not reviewed this encounter.   Clancy Gourd, NP 09/07/23 1045

## 2023-09-07 NOTE — ED Triage Notes (Signed)
 Pt presents with ear discomfort. Pt states she "hear fluid" in her ears. She states he thought it would just go away but in fact got worse over time. Pt denies pain when swallowing.
